# Patient Record
Sex: Female | Born: 1955 | Race: Asian | Hispanic: No | Marital: Single | Smoking: Current every day smoker
Health system: Southern US, Community
[De-identification: ages and names within clinical notes are randomized; demographics above are authoritative.]

## PROBLEM LIST (undated history)

## (undated) DIAGNOSIS — B1920 Unspecified viral hepatitis C without hepatic coma: Secondary | ICD-10-CM

## (undated) DIAGNOSIS — D649 Anemia, unspecified: Secondary | ICD-10-CM

## (undated) DIAGNOSIS — T7840XA Allergy, unspecified, initial encounter: Secondary | ICD-10-CM

## (undated) DIAGNOSIS — G629 Polyneuropathy, unspecified: Secondary | ICD-10-CM

## (undated) HISTORY — DX: Polyneuropathy, unspecified: G62.9

## (undated) HISTORY — DX: Unspecified viral hepatitis C without hepatic coma: B19.20

## (undated) HISTORY — DX: Allergy, unspecified, initial encounter: T78.40XA

## (undated) HISTORY — DX: Anemia, unspecified: D64.9

---

## 1998-05-22 ENCOUNTER — Ambulatory Visit (HOSPITAL_COMMUNITY): Admission: RE | Admit: 1998-05-22 | Discharge: 1998-05-22 | Payer: Self-pay

## 1998-06-10 ENCOUNTER — Encounter: Admission: RE | Admit: 1998-06-10 | Discharge: 1998-09-08 | Payer: Self-pay | Admitting: *Deleted

## 1998-07-25 ENCOUNTER — Encounter: Payer: Self-pay | Admitting: Internal Medicine

## 1998-07-25 ENCOUNTER — Ambulatory Visit (HOSPITAL_COMMUNITY): Admission: RE | Admit: 1998-07-25 | Discharge: 1998-07-25 | Payer: Self-pay | Admitting: Internal Medicine

## 1998-08-05 ENCOUNTER — Ambulatory Visit (HOSPITAL_COMMUNITY): Admission: RE | Admit: 1998-08-05 | Discharge: 1998-08-05 | Payer: Self-pay | Admitting: Internal Medicine

## 1999-04-21 ENCOUNTER — Encounter: Admission: RE | Admit: 1999-04-21 | Discharge: 1999-05-13 | Payer: Self-pay | Admitting: Internal Medicine

## 2002-11-11 ENCOUNTER — Ambulatory Visit (HOSPITAL_COMMUNITY): Admission: RE | Admit: 2002-11-11 | Discharge: 2002-11-11 | Payer: Self-pay | Admitting: Internal Medicine

## 2002-11-11 ENCOUNTER — Encounter (INDEPENDENT_AMBULATORY_CARE_PROVIDER_SITE_OTHER): Payer: Self-pay | Admitting: *Deleted

## 2002-11-11 ENCOUNTER — Encounter: Payer: Self-pay | Admitting: Internal Medicine

## 2003-01-14 ENCOUNTER — Emergency Department (HOSPITAL_COMMUNITY): Admission: EM | Admit: 2003-01-14 | Discharge: 2003-01-14 | Payer: Self-pay | Admitting: Emergency Medicine

## 2003-02-22 HISTORY — PX: LIVER BIOPSY: SHX301

## 2003-08-19 ENCOUNTER — Ambulatory Visit (HOSPITAL_COMMUNITY): Admission: RE | Admit: 2003-08-19 | Discharge: 2003-08-19 | Payer: Self-pay | Admitting: Internal Medicine

## 2003-10-28 ENCOUNTER — Ambulatory Visit: Payer: Self-pay | Admitting: Family Medicine

## 2003-10-31 ENCOUNTER — Ambulatory Visit: Payer: Self-pay | Admitting: *Deleted

## 2003-12-22 ENCOUNTER — Ambulatory Visit: Payer: Self-pay | Admitting: Family Medicine

## 2003-12-29 ENCOUNTER — Ambulatory Visit: Payer: Self-pay | Admitting: Gastroenterology

## 2004-01-26 ENCOUNTER — Ambulatory Visit: Payer: Self-pay | Admitting: Gastroenterology

## 2004-03-01 ENCOUNTER — Ambulatory Visit: Payer: Self-pay | Admitting: Gastroenterology

## 2004-03-12 ENCOUNTER — Ambulatory Visit: Payer: Self-pay | Admitting: Family Medicine

## 2004-03-19 ENCOUNTER — Ambulatory Visit (HOSPITAL_COMMUNITY): Admission: RE | Admit: 2004-03-19 | Discharge: 2004-03-19 | Payer: Self-pay | Admitting: Gastroenterology

## 2004-03-29 ENCOUNTER — Ambulatory Visit: Payer: Self-pay | Admitting: Gastroenterology

## 2004-04-26 ENCOUNTER — Ambulatory Visit: Payer: Self-pay | Admitting: Gastroenterology

## 2004-04-29 ENCOUNTER — Ambulatory Visit: Payer: Self-pay | Admitting: Family Medicine

## 2004-06-03 ENCOUNTER — Ambulatory Visit: Payer: Self-pay | Admitting: Nurse Practitioner

## 2004-10-14 ENCOUNTER — Ambulatory Visit: Payer: Self-pay | Admitting: Gastroenterology

## 2004-10-14 ENCOUNTER — Ambulatory Visit: Payer: Self-pay | Admitting: Family Medicine

## 2004-10-18 ENCOUNTER — Ambulatory Visit (HOSPITAL_COMMUNITY): Admission: RE | Admit: 2004-10-18 | Discharge: 2004-10-18 | Payer: Self-pay | Admitting: Internal Medicine

## 2004-11-11 ENCOUNTER — Ambulatory Visit: Payer: Self-pay | Admitting: Family Medicine

## 2005-02-23 ENCOUNTER — Ambulatory Visit: Payer: Self-pay | Admitting: Family Medicine

## 2005-02-24 ENCOUNTER — Ambulatory Visit (HOSPITAL_COMMUNITY): Admission: RE | Admit: 2005-02-24 | Discharge: 2005-02-24 | Payer: Self-pay | Admitting: Internal Medicine

## 2005-04-07 ENCOUNTER — Ambulatory Visit: Payer: Self-pay | Admitting: Gastroenterology

## 2005-09-16 ENCOUNTER — Ambulatory Visit: Payer: Self-pay | Admitting: Family Medicine

## 2005-11-29 ENCOUNTER — Ambulatory Visit: Payer: Self-pay | Admitting: Internal Medicine

## 2006-01-26 ENCOUNTER — Ambulatory Visit: Payer: Self-pay | Admitting: Family Medicine

## 2006-02-09 ENCOUNTER — Ambulatory Visit: Payer: Self-pay | Admitting: Family Medicine

## 2006-02-10 ENCOUNTER — Ambulatory Visit: Payer: Self-pay | Admitting: Family Medicine

## 2006-04-06 ENCOUNTER — Ambulatory Visit: Payer: Self-pay | Admitting: Gastroenterology

## 2006-07-31 ENCOUNTER — Ambulatory Visit: Payer: Self-pay | Admitting: Internal Medicine

## 2006-08-07 ENCOUNTER — Ambulatory Visit (HOSPITAL_COMMUNITY): Admission: RE | Admit: 2006-08-07 | Discharge: 2006-08-07 | Payer: Self-pay | Admitting: Family Medicine

## 2006-08-11 ENCOUNTER — Encounter: Admission: RE | Admit: 2006-08-11 | Discharge: 2006-08-11 | Payer: Self-pay | Admitting: Family Medicine

## 2006-11-08 ENCOUNTER — Encounter (INDEPENDENT_AMBULATORY_CARE_PROVIDER_SITE_OTHER): Payer: Self-pay | Admitting: *Deleted

## 2007-03-09 ENCOUNTER — Ambulatory Visit: Payer: Self-pay | Admitting: Internal Medicine

## 2007-03-09 ENCOUNTER — Encounter (INDEPENDENT_AMBULATORY_CARE_PROVIDER_SITE_OTHER): Payer: Self-pay | Admitting: Family Medicine

## 2007-03-09 LAB — CONVERTED CEMR LAB
ALT: 17 units/L (ref 0–35)
AST: 22 units/L (ref 0–37)
Alkaline Phosphatase: 55 units/L (ref 39–117)
Basophils Absolute: 0 10*3/uL (ref 0.0–0.1)
Basophils Relative: 0 % (ref 0–1)
CO2: 21 meq/L (ref 19–32)
Creatinine, Ser: 0.61 mg/dL (ref 0.40–1.20)
Eosinophils Absolute: 1.2 10*3/uL — ABNORMAL HIGH (ref 0.0–0.7)
Eosinophils Relative: 13 % — ABNORMAL HIGH (ref 0–5)
HCT: 39.3 % (ref 36.0–46.0)
Hemoglobin: 13.1 g/dL (ref 12.0–15.0)
Lymphocytes Relative: 32 % (ref 12–46)
MCHC: 33.3 g/dL (ref 30.0–36.0)
Monocytes Absolute: 0.7 10*3/uL (ref 0.1–1.0)
Platelets: 248 10*3/uL (ref 150–400)
RDW: 13.8 % (ref 11.5–15.5)
Sodium: 138 meq/L (ref 135–145)
TSH: 0.947 microintl units/mL (ref 0.350–5.50)
Total Bilirubin: 1 mg/dL (ref 0.3–1.2)
Total Protein: 7.1 g/dL (ref 6.0–8.3)

## 2007-03-23 ENCOUNTER — Ambulatory Visit (HOSPITAL_COMMUNITY): Admission: RE | Admit: 2007-03-23 | Discharge: 2007-03-23 | Payer: Self-pay | Admitting: Gastroenterology

## 2007-08-30 ENCOUNTER — Encounter: Admission: RE | Admit: 2007-08-30 | Discharge: 2007-08-30 | Payer: Self-pay | Admitting: Family Medicine

## 2007-09-12 ENCOUNTER — Encounter: Admission: RE | Admit: 2007-09-12 | Discharge: 2007-09-12 | Payer: Self-pay | Admitting: Family Medicine

## 2008-06-23 ENCOUNTER — Ambulatory Visit: Payer: Self-pay | Admitting: Internal Medicine

## 2008-07-07 ENCOUNTER — Ambulatory Visit: Payer: Self-pay | Admitting: Internal Medicine

## 2008-07-18 ENCOUNTER — Ambulatory Visit: Payer: Self-pay | Admitting: Internal Medicine

## 2008-08-18 ENCOUNTER — Ambulatory Visit: Payer: Self-pay | Admitting: Internal Medicine

## 2008-09-04 ENCOUNTER — Encounter: Admission: RE | Admit: 2008-09-04 | Discharge: 2008-09-04 | Payer: Self-pay | Admitting: Internal Medicine

## 2009-08-14 ENCOUNTER — Ambulatory Visit: Payer: Self-pay | Admitting: Internal Medicine

## 2009-08-14 LAB — CONVERTED CEMR LAB
ALT: 16 units/L (ref 0–35)
Albumin: 4.8 g/dL (ref 3.5–5.2)
Alkaline Phosphatase: 59 units/L (ref 39–117)
Basophils Relative: 0 % (ref 0–1)
CEA: 1.6 ng/mL (ref 0.0–5.0)
CO2: 27 meq/L (ref 19–32)
FSH: 111 milliintl units/mL
Ferritin: 29 ng/mL (ref 10–291)
Hemoglobin: 13.8 g/dL (ref 12.0–15.0)
LDL Cholesterol: 74 mg/dL (ref 0–99)
Lymphocytes Relative: 33 % (ref 12–46)
MCHC: 32.3 g/dL (ref 30.0–36.0)
Monocytes Absolute: 0.4 10*3/uL (ref 0.1–1.0)
Monocytes Relative: 6 % (ref 3–12)
Neutro Abs: 1.9 10*3/uL (ref 1.7–7.7)
Neutrophils Relative %: 32 % — ABNORMAL LOW (ref 43–77)
Potassium: 4.5 meq/L (ref 3.5–5.3)
RBC: 4.39 M/uL (ref 3.87–5.11)
Saturation Ratios: 31 % (ref 20–55)
Sodium: 138 meq/L (ref 135–145)
TIBC: 390 ug/dL (ref 250–470)
Total Bilirubin: 1.3 mg/dL — ABNORMAL HIGH (ref 0.3–1.2)
Total Protein: 7.9 g/dL (ref 6.0–8.3)
UIBC: 269 ug/dL
VLDL: 15 mg/dL (ref 0–40)
WBC: 6 10*3/uL (ref 4.0–10.5)

## 2009-09-09 ENCOUNTER — Ambulatory Visit: Payer: Self-pay | Admitting: Internal Medicine

## 2009-10-07 ENCOUNTER — Ambulatory Visit: Payer: Self-pay | Admitting: Internal Medicine

## 2010-06-02 ENCOUNTER — Ambulatory Visit (INDEPENDENT_AMBULATORY_CARE_PROVIDER_SITE_OTHER): Payer: Self-pay | Admitting: Family Medicine

## 2010-06-02 ENCOUNTER — Encounter: Payer: Self-pay | Admitting: Family Medicine

## 2010-06-02 ENCOUNTER — Ambulatory Visit (HOSPITAL_COMMUNITY)
Admission: RE | Admit: 2010-06-02 | Discharge: 2010-06-02 | Disposition: A | Discharge status: Home / Self Care | Payer: Self-pay | Source: Ambulatory Visit | Attending: Family Medicine | Admitting: Family Medicine

## 2010-06-02 ENCOUNTER — Other Ambulatory Visit: Payer: Self-pay

## 2010-06-02 DIAGNOSIS — J309 Allergic rhinitis, unspecified: Secondary | ICD-10-CM

## 2010-06-02 DIAGNOSIS — G5790 Unspecified mononeuropathy of unspecified lower limb: Secondary | ICD-10-CM

## 2010-06-02 DIAGNOSIS — E785 Hyperlipidemia, unspecified: Secondary | ICD-10-CM | POA: Insufficient documentation

## 2010-06-02 DIAGNOSIS — G8929 Other chronic pain: Secondary | ICD-10-CM

## 2010-06-02 DIAGNOSIS — G629 Polyneuropathy, unspecified: Secondary | ICD-10-CM | POA: Insufficient documentation

## 2010-06-02 DIAGNOSIS — K219 Gastro-esophageal reflux disease without esophagitis: Secondary | ICD-10-CM

## 2010-06-02 DIAGNOSIS — M545 Low back pain: Secondary | ICD-10-CM

## 2010-06-02 DIAGNOSIS — B1920 Unspecified viral hepatitis C without hepatic coma: Secondary | ICD-10-CM

## 2010-06-02 DIAGNOSIS — R002 Palpitations: Secondary | ICD-10-CM | POA: Insufficient documentation

## 2010-06-02 DIAGNOSIS — R079 Chest pain, unspecified: Secondary | ICD-10-CM

## 2010-06-02 DIAGNOSIS — N6009 Solitary cyst of unspecified breast: Secondary | ICD-10-CM

## 2010-06-02 DIAGNOSIS — I10 Essential (primary) hypertension: Secondary | ICD-10-CM | POA: Insufficient documentation

## 2010-06-02 DIAGNOSIS — N6002 Solitary cyst of left breast: Secondary | ICD-10-CM

## 2010-06-02 LAB — CBC
HCT: 35.1 % — ABNORMAL LOW (ref 36.0–46.0)
MCH: 32.3 pg (ref 26.0–34.0)
MCHC: 34.2 g/dL (ref 30.0–36.0)
MCV: 94.4 fL (ref 78.0–100.0)
RDW: 13.5 % (ref 11.5–15.5)
WBC: 6.4 10*3/uL (ref 4.0–10.5)

## 2010-06-02 LAB — TSH: TSH: 0.827 u[IU]/mL (ref 0.350–4.500)

## 2010-06-02 LAB — COMPREHENSIVE METABOLIC PANEL
ALT: 18 U/L (ref 0–35)
AST: 27 U/L (ref 0–37)
Alkaline Phosphatase: 42 U/L (ref 39–117)
Sodium: 139 mEq/L (ref 135–145)
Total Bilirubin: 0.7 mg/dL (ref 0.3–1.2)
Total Protein: 7.3 g/dL (ref 6.0–8.3)

## 2010-06-02 LAB — LIPID PANEL
HDL: 66 mg/dL (ref >39)
LDL Cholesterol: 92 mg/dL (ref 0–99)
Total CHOL/HDL Ratio: 2.5 Ratio
VLDL: 10 mg/dL (ref 0–40)

## 2010-06-02 MED ORDER — METOPROLOL TARTRATE 25 MG PO TABS: ORAL_TABLET | ORAL | Status: DC

## 2010-06-02 MED ORDER — CLOBETASOL 17 PROPIONATE 0.5 % POWD: Status: DC

## 2010-06-02 NOTE — Patient Instructions (Signed)
It was very nice to meet you---I am honored to have another member of the [facility] family! I will send you a note about your blood work in the mail. I have started a new cream today for your skin rash and I have changed one of your blood pressure medicines. I would like to see you back in about a month or so.  Regarding your medicines---for now STOP the hydrochlorothiazide--keep it, we may want to try it again sometime. In it's place I will START lopressor--you will take one pill TWICE a day. Keep taking the rest of your medicines the same. The new cream for your rash will be three times a day on your neck and once a day on your face. You can use the cream you already have--the hydrocortisone---on you face two other times a day.

## 2010-06-03 ENCOUNTER — Encounter: Payer: Self-pay | Admitting: Family Medicine

## 2010-06-03 DIAGNOSIS — G8929 Other chronic pain: Secondary | ICD-10-CM | POA: Insufficient documentation

## 2010-06-03 DIAGNOSIS — J309 Allergic rhinitis, unspecified: Secondary | ICD-10-CM | POA: Insufficient documentation

## 2010-06-03 DIAGNOSIS — K219 Gastro-esophageal reflux disease without esophagitis: Secondary | ICD-10-CM | POA: Insufficient documentation

## 2010-06-03 DIAGNOSIS — N6002 Solitary cyst of left breast: Secondary | ICD-10-CM | POA: Insufficient documentation

## 2010-06-03 DIAGNOSIS — B1920 Unspecified viral hepatitis C without hepatic coma: Secondary | ICD-10-CM | POA: Insufficient documentation

## 2010-06-03 HISTORY — DX: Other chronic pain: G89.29

## 2010-06-03 NOTE — Progress Notes (Signed)
  Subjective:    Patient ID: Sarah Pruitt, female    DOB: 10-22-1955, 55 y.o.   MRN: [id number]  HPI  New patient here for establishment of care  As chronic low back pain. It is unchanged.  Has hypertension and is complaining of some chest pain most nights when she lies down. Feels like a rock is sitting on her chest. Does not notice this with exertion. Does note that she is more fatigued recently been previously. Has not had any unusual weight loss or weight gain.  Has hypertension and takes her medications regularly but feels her heart flutter several times throughout the day and this concerned her quite a bit  Has a rash on her face and neck. They gave her some cream which seems to help a little bit.   Has a lot of problems with foot numbness and burning. It is mostly the forefoot. Constant very bothersome to her has decreased sensation there. This has been this way for several years. She is currently on Neurontin, and it doesn't seem to help much. She has occasional numbness in her hands and notices some occasional color change but this is not as bothersome to her as the nerve issues in her feet. Review of Systems  Constitutional: Positive for fatigue. Negative for fever, activity change, appetite change and unexpected weight change.  HENT: Negative for neck pain.   Eyes: Negative for pain and visual disturbance.  Respiratory: Negative for cough and shortness of breath.   Gastrointestinal: Negative for abdominal pain.  Genitourinary: Negative for dysuria and vaginal bleeding.  Musculoskeletal: Positive for back pain.  Neurological: Positive for numbness. Negative for tremors, weakness, light-headedness and headaches.       Objective:   Physical Exam  Constitutional: She is oriented to person, place, and time. She appears well-developed and well-nourished.  Eyes: Conjunctivae are normal.  Cardiovascular: Normal rate, regular rhythm, normal heart sounds and intact distal pulses.     No murmur heard. Pulmonary/Chest: Effort normal and breath sounds normal. She has no wheezes. She has no rales.  Abdominal: Soft. Bowel sounds are normal.  Musculoskeletal: Normal range of motion. She exhibits no edema and no tenderness.  Neurological: She is alert and oriented to person, place, and time. She has normal reflexes.       Decreased sensation to soft touch but not proprioception in B forefoot.  Skin:       Atopic changes mild on face around mouth. Significant rectangular area and erythema, excoriation, lichenification on posterior neck--4x6 cm.  Psychiatric: Her behavior is normal. Judgment and thought content normal.          Assessment & Plan:  #1 hypertension: Seems well controlled. Gives history consistent with some PVCs. These seem to bother her quite a bit. EKG today showed normal sinus rhythm Will change her HCTZ to Lopressor at a low dose and follow her up in 3 weeks.  #2 peripheral neuropathy. I don't have any of her old records. She is on vitamin B12. He is actually on fairly high doses of that. Will check B12 level thyroid CBC CMP.  #3 eczema: The area on the back of her neck is fairly severe we'll give her some clobetasol for that and follow her up in 3 weeks.  #4 health maintenance. She is current on her Pap smear and her mammogram. Chest had history of solitary left breast cyst. Sitting she gets an ultrasound every year after her mammogram. Not having any issues currently

## 2010-07-07 ENCOUNTER — Ambulatory Visit (INDEPENDENT_AMBULATORY_CARE_PROVIDER_SITE_OTHER): Payer: Self-pay | Admitting: Family Medicine

## 2010-07-07 ENCOUNTER — Encounter: Payer: Self-pay | Admitting: Family Medicine

## 2010-07-07 DIAGNOSIS — I10 Essential (primary) hypertension: Secondary | ICD-10-CM

## 2010-07-07 DIAGNOSIS — L259 Unspecified contact dermatitis, unspecified cause: Secondary | ICD-10-CM

## 2010-07-07 DIAGNOSIS — G5790 Unspecified mononeuropathy of unspecified lower limb: Secondary | ICD-10-CM

## 2010-07-07 DIAGNOSIS — L309 Dermatitis, unspecified: Secondary | ICD-10-CM | POA: Insufficient documentation

## 2010-07-07 MED ORDER — OMEPRAZOLE 20 MG PO CPDR: 20.0000 mg | DELAYED_RELEASE_CAPSULE | Freq: Every day | ORAL | Status: DC

## 2010-07-07 MED ORDER — METOPROLOL TARTRATE 50 MG PO TABS: 50.0000 mg | ORAL_TABLET | Freq: Two times a day (BID) | ORAL | Status: DC

## 2010-07-07 NOTE — Progress Notes (Signed)
  Subjective:    Patient ID: Sarah Pruitt, female    DOB: 01-03-1956, 55 y.o.   MRN: [id number]  HPI  Followup change of hypertensive medication. She is tolerating the extremity low-dose beta blocker without issues. She continues to feel the heart fluttering.  #2 financial issues with getting her medication since she is no longer connected with [facility].  #3 continues to have numbness and tingling in bilateral hands and feet.  #4: Followup rash. This has almost totally resolved and she is quite happy about that.  Review of Systems    Pertinent review of systems: negative for fever or unusual weight change.  Objective:   Physical Exam    GENERALl: Well developed, well nourished, in no acute distress. NECK: Supple, FROM, without lymphadenopathy.  THYROID: normal without nodularity CAROTID ARTERIES: without bruits LUNGS: clear to auscultation bilaterally. No wheezes or rales. HEART: Regular rate and rhythm, no murmurs ABDOMEN: soft with positive bowel sounds NEURO: decreased sensation to soft touch fingertips and feet.      Assessment & Plan:  #1: Hypertension with frequent PVCs by history. I started the beta blocker at an extremely low dose to make sure she would tolerate the symptoms and side effects that are possible. She seems to have tolerated this well so we will increase her today. This is still probably not the day she needs it she is quite hypertensive today. I'll see her back in 3-4 weeks.  #2: Neuropathy. I wanted to increase her Neurontin but with her new financial issues some nausea regard to be able to even keep her on the Neurontin. I want to check into some options for the drug company to provide that. #3. Eczema totally resolved for now

## 2010-07-07 NOTE — Patient Instructions (Signed)
For now we are STOPPING the following medicines: B6, B 12, valtrex (valacyclovir),  And vytorin (ezetamid/ simvastatim)  I am  REPLACING:Nexium (esommeprazole) with omeprazole 20 mg REPLACING Metoprolol 25 mg taking 1/2 tab twice a day with metoprolol 50 mg taking a whole tab twice a day. We will keep the gabapentin teh same for now--I will see if I can find a way to change that to something else or get some from a program by the time I see you in 2 weeks

## 2010-07-09 NOTE — Op Note (Signed)
NAME:  Sarah Pruitt, [name]              ACCOUNT NO.:  [id number]   MEDICAL RECORD NO.:  [id number]          PATIENT TYPE:  AMB   LOCATION:  ENDO                         FACILITY:  [facility]   PHYSICIAN:  James L. Malon Kindle., M.D.DATE OF BIRTH:  1955/08/28   DATE OF PROCEDURE:  03/19/2004  DATE OF DISCHARGE:                                 OPERATIVE REPORT   PROCEDURE:  Esophagogastroduodenoscopy.   MEDICATIONS GIVEN:  Fentanyl 40 mcg, Versed 4 mg IV, Cetacaine spray.   INDICATIONS FOR PROCEDURE:  Epigastric pain.   DESCRIPTION OF PROCEDURE:  The procedure was explained to the patient and  consent obtained.  With the patient in the left lateral decubitus position,  the Olympus scope was inserted and advanced.  The stomach was entered, the  pylorus identified and passed.  The duodenum including the bulb and second  portion were seen well and was unremarkable.  The scope was withdrawn back  into the stomach.  The pyloric channel, antrum, and body were normal.  The  fundus and cardia were seen on the retroflex view and was normal.  There was  a hiatal hernia with widely patent GE junction and somewhat reddened distal  esophagus.  The proximal esophagus was normal.  The scope was withdrawn.  The patient tolerated the procedure well.   ASSESSMENT:  Epigastric pain, possible reflux.   PLAN:  Will continue the Nexium, give a reflux sheet, see back in our office  in 4-6 weeks.      JLE/MEDQ  D:  03/19/2004  T:  03/19/2004  Job:  [id number]   cc:   Tresa Endo L. Philipp Deputy, M.D.  [phone]  [place] [address]  Fax: [phone]

## 2010-07-23 ENCOUNTER — Encounter: Payer: Self-pay | Admitting: Family Medicine

## 2010-07-23 ENCOUNTER — Ambulatory Visit (INDEPENDENT_AMBULATORY_CARE_PROVIDER_SITE_OTHER): Payer: Self-pay | Admitting: Family Medicine

## 2010-07-23 DIAGNOSIS — G5600 Carpal tunnel syndrome, unspecified upper limb: Secondary | ICD-10-CM

## 2010-07-23 DIAGNOSIS — G5602 Carpal tunnel syndrome, left upper limb: Secondary | ICD-10-CM

## 2010-07-23 MED ORDER — FAMOTIDINE 20 MG PO TABS: 20.0000 mg | ORAL_TABLET | Freq: Two times a day (BID) | ORAL | Status: DC

## 2010-07-23 MED ORDER — AMITRIPTYLINE HCL 75 MG PO TABS: 75.0000 mg | ORAL_TABLET | Freq: Every day | ORAL | Status: DC

## 2010-07-23 NOTE — Progress Notes (Signed)
  Subjective:    Patient ID: Sarah Pruitt, female    DOB: 05-May-1955, 55 y.o.   MRN: [id number]  HPI  Followup bilateral hand tingling and numbness. No specific injury. This has been going on for many months. Right hand dominant. Left hand has worse symptoms. Symptoms are there all day and all night. Occasionally awakens her at night with pain. The worse time is first thing in the morning. She also had some numbness and tingling in her feet but this is not nearly as severe.  Review of Systems Pertinent review of systems: negative for fever or unusual weight change.     Objective:   Physical Exam     GENERAL: Well-developed well-nourished no acute distress WRISTS: Positive Phalen's bilaterally negative Tinel bilaterally. Neither hand reveals thenar atrophy. Her grip strength is normal. Sensation to soft touch is intact. Radial pulses 2+ bilaterally equal.  ULTRASOUND: Thickened median nerves that are flattened bilaterally were revealed on limited ultrasound.  Volume of left median nerve 1.7 and a right 1.57.  INJECTION: Patient was given informed consent, signed copy in the chart. Appropriate time out was taken. Area prepped and draped in usual sterile fashion. 1/2 cc of kenalog plus  1/2 cc of lidocaine was injected into the leftcarpal tunnel using a(n) vo;ar approach. The patient tolerated the procedure well. There were no complications. Post procedure instructions were given.  Assessment & Plan:  #1 colon neuropathy. I think part of this may be related to carpal tunnel and we gave her night splint today as well as a corticosteroid injection in the left. I will see her in followup in 3-4 weeks. I do not think this is all of her issues as she has symptoms in her feet as well.   #2: Financial issues with some of her medicines namely gabapentin. She is unable to afford that. We'll switch her to amitriptyline. She is also unable to afford omeprazole and we will switch to famotidine.

## 2010-07-23 NOTE — Patient Instructions (Addendum)
You have an appointment with Dr. Jennette Kettle at the [facility] on June 27 at 8:45am. I have changed your gabap[entin to amitriptylene--one 75 mg tab at night. I have changed your omeprazole to famotidine--take one tab TWICE a day.

## 2010-08-18 ENCOUNTER — Ambulatory Visit: Payer: Self-pay | Admitting: Family Medicine

## 2010-08-20 ENCOUNTER — Ambulatory Visit (INDEPENDENT_AMBULATORY_CARE_PROVIDER_SITE_OTHER): Payer: Self-pay | Admitting: Family Medicine

## 2010-08-20 ENCOUNTER — Encounter: Payer: Self-pay | Admitting: Family Medicine

## 2010-08-20 DIAGNOSIS — G609 Hereditary and idiopathic neuropathy, unspecified: Secondary | ICD-10-CM

## 2010-08-20 DIAGNOSIS — I10 Essential (primary) hypertension: Secondary | ICD-10-CM

## 2010-08-20 DIAGNOSIS — G629 Polyneuropathy, unspecified: Secondary | ICD-10-CM

## 2010-08-20 NOTE — Progress Notes (Signed)
  Subjective:    Patient ID: Sarah Pruitt, female    DOB: [birth year], 55 y.o.   MRN: [id number]  HPI  F/u starting several med changes. Some improvement in her neuropathy with teh wrist injection--but not enough improvement that she wants to try a shot in other wrist.  Biggest issue for her now is her Mother's safety--her brother  Is becoming more aggressive and with angry outbursts. She wants to know what she should do--she is afraid the stress is too much for her Mom. She does not fear for her own safety but has some concerns re taht of her Mom. Her brother has had several angry outbursts recently--has not physically assaulted anyone but seems threatening.  Review of Systems    Pertinent review of systems: negative for fever or unusual weight change.  Objective:   Physical Exam     GENERAL: Well-developed, well-nourished, no acute distress. CARDIOVASCULAR: Regular rate and rhythm no murmur gallop or rub LUNGS: Clear to auscultation bilaterally, no rales or wheeze. ABDOMEN: Soft positive bowel sounds NEURO: No gross focal neurological deficits he has subjective sensory paraesthesias  And numbness in hands and some slight difference in sense of soft touch all ten fingers on palmar surface and dorsally on finger tips. MSK: Movement of extremity x 4.     Assessment & Plan:  1. Neuropathy--stay w new med regimen. (amitriptylene) 2. Long discussion re her family issues.  I called teh social worker for her brother who is also my patient and left mssg Sula Soda at [phone]

## 2010-09-22 ENCOUNTER — Ambulatory Visit (INDEPENDENT_AMBULATORY_CARE_PROVIDER_SITE_OTHER): Payer: Self-pay | Admitting: Family Medicine

## 2010-09-22 ENCOUNTER — Encounter: Payer: Self-pay | Admitting: Family Medicine

## 2010-09-22 DIAGNOSIS — G629 Polyneuropathy, unspecified: Secondary | ICD-10-CM

## 2010-09-22 DIAGNOSIS — R002 Palpitations: Secondary | ICD-10-CM

## 2010-09-22 DIAGNOSIS — G609 Hereditary and idiopathic neuropathy, unspecified: Secondary | ICD-10-CM

## 2010-09-22 DIAGNOSIS — K219 Gastro-esophageal reflux disease without esophagitis: Secondary | ICD-10-CM

## 2010-09-22 NOTE — Progress Notes (Signed)
  Subjective:    Patient ID: Sarah Pruitt, female    DOB: 1955/10/18, 55 y.o.   MRN: [id number]  HPI  1 f/u gerd--much better 2-- f/u palpitations--only very occasionally  3--f/u neuropathy--75% improved on amitriptyline. Does have a new area numbness on right lateral thigh, but the burning and tingling in hands and feet much better  4--f/u issues w her brother--she has started going to his mental health appts w him--they have adjusted some meds and things are better.  Review of Systems    Pertinent review of systems: negative for fever or unusual weight change.  Objective:   Physical Exam  GENERAL: Well-developed, well-nourished, no acute distress. CARDIOVASCULAR: Regular rate and rhythm no murmur gallop or rub LUNGS: Clear to auscultation bilaterally, no rales or wheeze. ABDOMEN: Soft positive bowel sounds NEURO: No gross focal neurological deficits. Area of numbness right lateral thigh in area of  lateral femoral cutaneous nerve MSK: Movement of extremity x 4.        Assessment & Plan:  1. GERD--improved 2. Palpitations--significant improvement 3. Peripheral neuropathy  Significant improvement 5. Social issues at home--improved Continue current med regimen--rtc 4-6 m and prn

## 2010-11-22 ENCOUNTER — Other Ambulatory Visit: Payer: Self-pay | Admitting: Family Medicine

## 2010-11-22 NOTE — Telephone Encounter (Signed)
Forwarded to pcp.Sarah Pruitt Lynetta  

## 2010-11-22 NOTE — Telephone Encounter (Signed)
Pt needs refill on Mobic - she had not had it filled from here yet. [facility]

## 2010-11-23 MED ORDER — MELOXICAM 7.5 MG PO TABS: 7.5000 mg | ORAL_TABLET | Freq: Every day | ORAL | Status: DC

## 2010-11-23 NOTE — Telephone Encounter (Signed)
Called and informed of below.

## 2010-11-23 NOTE — Telephone Encounter (Signed)
Dear Cliffton Asters Team This is done plz let her know THANKS! Denny Levy

## 2010-12-09 ENCOUNTER — Other Ambulatory Visit: Payer: Self-pay | Admitting: Family Medicine

## 2010-12-09 MED ORDER — LISINOPRIL 20 MG PO TABS: 20.0000 mg | ORAL_TABLET | Freq: Every day | ORAL | Status: DC

## 2011-06-08 ENCOUNTER — Ambulatory Visit (INDEPENDENT_AMBULATORY_CARE_PROVIDER_SITE_OTHER): Payer: Self-pay | Admitting: Family Medicine

## 2011-06-08 ENCOUNTER — Encounter: Payer: Self-pay | Admitting: Family Medicine

## 2011-06-08 VITALS — BP 145/97 | HR 84 | Temp 97.9°F | Ht 62.0 in | Wt 119.0 lb

## 2011-06-08 DIAGNOSIS — E785 Hyperlipidemia, unspecified: Secondary | ICD-10-CM

## 2011-06-08 DIAGNOSIS — R002 Palpitations: Secondary | ICD-10-CM

## 2011-06-08 DIAGNOSIS — L259 Unspecified contact dermatitis, unspecified cause: Secondary | ICD-10-CM

## 2011-06-08 DIAGNOSIS — G609 Hereditary and idiopathic neuropathy, unspecified: Secondary | ICD-10-CM

## 2011-06-08 DIAGNOSIS — M79609 Pain in unspecified limb: Secondary | ICD-10-CM

## 2011-06-08 DIAGNOSIS — L309 Dermatitis, unspecified: Secondary | ICD-10-CM

## 2011-06-08 DIAGNOSIS — M79673 Pain in unspecified foot: Secondary | ICD-10-CM

## 2011-06-08 DIAGNOSIS — I10 Essential (primary) hypertension: Secondary | ICD-10-CM

## 2011-06-08 DIAGNOSIS — G629 Polyneuropathy, unspecified: Secondary | ICD-10-CM

## 2011-06-08 LAB — COMPREHENSIVE METABOLIC PANEL
Albumin: 4.6 g/dL (ref 3.5–5.2)
BUN: 21 mg/dL (ref 6–23)
CO2: 26 mEq/L (ref 19–32)
Calcium: 9.8 mg/dL (ref 8.4–10.5)
Chloride: 103 mEq/L (ref 96–112)
Glucose, Bld: 111 mg/dL — ABNORMAL HIGH (ref 70–99)
Potassium: 4.4 mEq/L (ref 3.5–5.3)
Sodium: 139 mEq/L (ref 135–145)
Total Protein: 7.6 g/dL (ref 6.0–8.3)

## 2011-06-08 LAB — LDL CHOLESTEROL, DIRECT: Direct LDL: 176 mg/dL — ABNORMAL HIGH

## 2011-06-08 NOTE — Patient Instructions (Addendum)
REGARDING your blood pressure meds---restart the metoprolol at 50 mg a day--take it EVERY day and let's recheck in 6 weeks.  For your dry skin and rash on hands---it is a type of excema--use vaseline once or twice a day and use the hydrocortisone cram once or twice a day as well. It will never go completely away, but it should improve.  I want to see you back in 6 weeks. I will send you a letter about your blood work results.  I will mail you some metatarsal pads to try for  Your toe numbness. It was great to see you---and to see you [facility]!

## 2011-06-09 NOTE — Assessment & Plan Note (Signed)
He did really well controlled on the last 2 office visits. Has not taken her medicine for today so I refilled her medicine today. She is going to keep some blood pressure numbers for me in followup in 4-6 weeks. Would probably be advisable to get lab work sometime at the next visit.

## 2011-06-09 NOTE — Assessment & Plan Note (Signed)
He seemed essentially resolved on the beta blocker.

## 2011-06-09 NOTE — Assessment & Plan Note (Signed)
Has had significant improvement since the initiation of the steroid cream. She's not been using it fairly regularly. We'll have her discontinue the lotion, and Vaseline at night. Increase use of steroid cream. We discussed hand eczema in general.

## 2011-06-09 NOTE — Progress Notes (Signed)
  Subjective:    Patient ID: Sarah Pruitt, female    DOB: 05-Aug-1955, 56 y.o.   MRN: [id number]  HPI  #1. Followup hypertension. She ran out of her medicine 2 days ago and has not taken it since. She ran out of the beta blocker she is continued on the lisinopril. She's had no problems with either one of [place] felt much better overall since she started the medication. Less headaches, less heart palpitations. Denies shortness of breath, denies chest pains. Experiencing no lower extremity edema. #2. Carpal tunnel symptoms in the left hand have returned. She is having some tingling and numbness in the index and long finger as well as the palm. About a year ago we had given her an injection in that totally relieved her symptoms for almost a year. She would like to pursue that again. #3. Continues to have eczema on bilateral hands. She's been using some lotion as a moisturizer and intermittent use of the steroid cream. The palmar part of the rash is improved but she still having difficulty with the distal portions of her fingers and the area around the nail. #4. Left second and third toe numbness and tingling that occurs after she's been standing for a period of time. She's not seeing any color change in the toes. She's had no toe injury.  Review of Systems Denies fever, sweats, chills. Please see history of present illness above for other pertinent review of systems.    Objective:   Physical Exam  Vital signs reviewed. GENERAL: Well-developed, well-nourished, no acute distress. CARDIOVASCULAR: Regular rate and rhythm no murmur gallop or rub LUNGS: Clear to auscultation bilaterally, no rales or wheeze. ABDOMEN: Soft positive bowel sounds NEURO: No gross focal neurological deficits. MSK: Movement of extremity x 4. Positive Phalen's left wrist. Positive Tinel left wrist. No thenar atrophy. Grip strength is normal. Skin: Erythema bilateral palms but no excoriations no scale no flakiness. Area around the  nailbeds distally she has a lot of thickened dry skin with some cracking and peeling. The nails themselves appear normal. Sensation in the hand is normal.      Assessment & Plan:

## 2011-06-20 ENCOUNTER — Encounter: Payer: Self-pay | Admitting: Family Medicine

## 2011-06-20 ENCOUNTER — Other Ambulatory Visit: Payer: Self-pay | Admitting: Family Medicine

## 2011-06-20 MED ORDER — SIMVASTATIN 40 MG PO TABS: 40.0000 mg | ORAL_TABLET | Freq: Every day | ORAL | Status: DC

## 2011-07-20 ENCOUNTER — Encounter: Payer: Self-pay | Admitting: Family Medicine

## 2011-07-20 ENCOUNTER — Ambulatory Visit: Payer: Self-pay | Admitting: Family Medicine

## 2011-07-20 ENCOUNTER — Ambulatory Visit (INDEPENDENT_AMBULATORY_CARE_PROVIDER_SITE_OTHER): Payer: Self-pay | Admitting: Family Medicine

## 2011-07-20 VITALS — BP 122/79 | HR 59 | Temp 98.0°F | Ht 62.0 in | Wt 121.1 lb

## 2011-07-20 DIAGNOSIS — I10 Essential (primary) hypertension: Secondary | ICD-10-CM

## 2011-07-22 NOTE — Assessment & Plan Note (Signed)
Well controlled. No med changes Reviewed labs F/u 6 m

## 2011-07-22 NOTE — Progress Notes (Signed)
  Subjective:    Patient ID: Sarah Pruitt, female    DOB: 03-Aug-1955, 56 y.o.   MRN: [id number]  HPI  HTN F/U: Taking medicines regularly without problems. Denies chest pain, shortness of breath. Home BP readings 110-123 / 70-80.  Review of Systems    Pertinent review of systems: negative for fever or unusual weight change. Objective:   Physical Exam   Vital signs reviewed. GENERAL: Well-developed, well-nourished, no acute distress. CARDIOVASCULAR: Regular rate and rhythm no murmur gallop or rub LUNGS: Clear to auscultation bilaterally, no rales or wheeze. ABDOMEN: Soft positive bowel sounds NEURO: No gross focal neurological deficits. MSK: Movement of extremity x 4.       Assessment & Plan:

## 2011-07-28 ENCOUNTER — Ambulatory Visit (INDEPENDENT_AMBULATORY_CARE_PROVIDER_SITE_OTHER): Payer: Self-pay | Admitting: Family Medicine

## 2011-07-28 VITALS — BP 137/80 | HR 64 | Temp 98.3°F | Ht 62.0 in | Wt 122.0 lb

## 2011-07-28 DIAGNOSIS — M6740 Ganglion, unspecified site: Secondary | ICD-10-CM

## 2011-07-28 NOTE — Patient Instructions (Signed)
It was great to see you today.  It appears you had a ganglion cyst on your wrist. Please keep the bandage on for 30 minutes, then you can take it off. Schedule follow up appointment with Dr. Jennette Kettle in 2-3 weeks to look at your wrist.   If you have any concerns, please call MD.

## 2011-07-28 NOTE — Progress Notes (Signed)
  Subjective:    Patient ID: Sarah Pruitt, female    DOB: Nov 24, 1955, 56 y.o.   MRN: [id number]  HPI  Patient presents to clinic for right hand pain.  She fell onto outstretched hand about 4 weeks ago.  She was gardening, lost balance, and fell on her side on a weeding tool.  About one week ago, she noticed a knot on her RT wrist.  Non-tender.  She denies any recent swelling, redness, or worsening pain.  She does complain of wrist soreness only when she rotates her wrist a certain way.   Denies any fever, chills, nausea/vomiting or any other systemic symptoms.  Review of Systems  Per HPI    Objective:   Physical Exam  Wrist: Inspection normal with no visible erythema or swelling. ROM smooth and normal with good flexion and extension and ulnar/radial deviation that is symmetrical with opposite wrist. Palpated a round, immobile nodule on RT radial aspect of wrist. Tendons without tenderness/ swelling RT: Strength 4/5 in all directions without pain. LT: 5/5 strength in all directions without pain. Negative Finkelstein, tinel's and phalens.   ULTRASOUND:       Assessment & Plan:

## 2011-07-29 ENCOUNTER — Ambulatory Visit: Payer: Self-pay | Admitting: Family Medicine

## 2011-08-02 NOTE — Progress Notes (Signed)
Patient ID: Sarah Pruitt, female   DOB: Jul 24, 1955, 56 y.o.   MRN: [id number] US reveals echoic round cyst like structure ASPIRATION/INJECTION: Patient was given informed consent, signed copy in the chart. Appropriate time out was taken. Area prepped and draped in usual sterile fashion. Local anesthesia with 2 c 1% lidocaine without epi. Attempted aspiration of cyst revealed no fluid. . 1/2 cc of methylprednisolone 40 mg/ml was injected into the area of the cyst. The patient tolerated the procedure well. There were no complications. Post procedure instructions were given.

## 2011-08-04 ENCOUNTER — Telehealth: Payer: Self-pay | Admitting: Family Medicine

## 2011-08-04 NOTE — Telephone Encounter (Signed)
Patient is calling back to find out if with the [facility], does she need a referral for a Colonoscopy.  She said that Dr. Jennette Kettle told her she needed on, but she has not heard back from anyone.

## 2011-08-04 NOTE — Telephone Encounter (Signed)
There is not a referral request for GI for colonoscopy noted in chart. Explained to patient about no GI available for referral through [facility].  Explained how this works  . Will forward message to Dr. Jennette Kettle  to ask about referral and if patient needs referral to [facility] or [facility]  if necessary.   Also patient cancelled her follow up appointment because she states the cyst is gone on her wrist.

## 2011-08-08 NOTE — Telephone Encounter (Signed)
Dear Cliffton Asters Team We can refer her to either [facility] or [facility]---see if she has a preference---maybe you can tell her if tehre is a copay---Ill bet there is--or she can call them and find out if you give her the number---she is resourceful.

## 2011-08-17 ENCOUNTER — Ambulatory Visit: Payer: Self-pay | Admitting: Family Medicine

## 2011-09-18 ENCOUNTER — Other Ambulatory Visit: Payer: Self-pay | Admitting: Family Medicine

## 2011-12-24 ENCOUNTER — Other Ambulatory Visit: Payer: Self-pay | Admitting: Family Medicine

## 2012-03-08 ENCOUNTER — Encounter: Payer: Self-pay | Admitting: Family Medicine

## 2012-03-08 ENCOUNTER — Ambulatory Visit (INDEPENDENT_AMBULATORY_CARE_PROVIDER_SITE_OTHER): Payer: PRIVATE HEALTH INSURANCE | Admitting: Family Medicine

## 2012-03-08 VITALS — BP 166/87 | HR 58 | Temp 99.3°F | Wt 130.6 lb

## 2012-03-08 DIAGNOSIS — K219 Gastro-esophageal reflux disease without esophagitis: Secondary | ICD-10-CM

## 2012-03-08 DIAGNOSIS — I10 Essential (primary) hypertension: Secondary | ICD-10-CM

## 2012-03-08 DIAGNOSIS — L259 Unspecified contact dermatitis, unspecified cause: Secondary | ICD-10-CM

## 2012-03-08 DIAGNOSIS — L309 Dermatitis, unspecified: Secondary | ICD-10-CM

## 2012-03-08 DIAGNOSIS — E785 Hyperlipidemia, unspecified: Secondary | ICD-10-CM

## 2012-03-08 MED ORDER — CLOBETASOL 17 PROPIONATE 0.5 % POWD: Status: DC

## 2012-03-08 MED ORDER — OMEPRAZOLE 40 MG PO CPDR: 40.0000 mg | DELAYED_RELEASE_CAPSULE | Freq: Every day | ORAL | Status: DC

## 2012-03-08 MED ORDER — MELOXICAM 7.5 MG PO TABS: 15.0000 mg | ORAL_TABLET | Freq: Every day | ORAL | Status: DC

## 2012-03-08 NOTE — Patient Instructions (Signed)
I am giving you another prescription for the clobetasol. I want you to use a LOT of it all over your hands at night, them put on a pair of gloves. In the  Morning, use a light coat of vaseline---NO LOTION---and repeat the vaseline a light coat several times a day.  When you run out of the lisinopril 20 mg, get the NEW PRESCRIPTION of the 40 mg filled and take that instead. Continue the metoprolol See me back in TWO MONTHS!

## 2012-03-09 NOTE — Assessment & Plan Note (Signed)
Has been out of her medicine for a little while. We'll do some refills.

## 2012-03-09 NOTE — Assessment & Plan Note (Signed)
Will increase her lisinopril to 40 mg daily. She just got this refilled so we can wait until she completes this prescription and then go to the increase or she can double the current dose until she runs out. I'll see her back when she's on the increased dose.

## 2012-03-09 NOTE — Assessment & Plan Note (Signed)
We'll start her on nightly clobetasol for the hands. DC a general lotion and use Vaseline. Return to clinic one month.

## 2012-03-09 NOTE — Assessment & Plan Note (Signed)
Is having some breakthrough reflux symptoms on the twice a day Pepcid. We'll try  omeprazole 40 mg daily and followup 4-6 weeks

## 2012-03-09 NOTE — Progress Notes (Signed)
  Subjective:    Patient ID: Sarah Pruitt, female    DOB: [birth year], 57 y.o.   MRN: [id number]  HPI  #1. Followup hypertension. She says she's been taking her medicines regularly and without difficulty. She has not been checking her blood pressures. She denies headaches, source of breath, edema, chest pain. #2. Hand eczema. This is gotten worse recently and her mom made her come in to be seen for this. She's been using a little bit of the clobetasol that we had previously given her for a different area of atopic dermatitis. She's been putting small amounts on her fingers once a day. She's been using over-the-counter lotion every 2 hours during the day. Her hands are itching, painful, skin is cracking and there is some bleeding. #3. Having some breakthrough reflux symptoms. Currently taking Pepcid twice a day.  Review of Systems See history of present illness    Objective:   Physical Exam Vital signs are reviewed and elevated blood pressure is noted. GENERAL: Well-developed female no acute distress CARDIOVASCULAR regular rate and rhythm without murmur gallop or rub NECK: No lymphadenopathy no thyromegaly no carotid bruits. LUNGS: Clear to auscultation bilaterally SKIN. Hands bilaterally have eczematous changes with some cracking and bleeding over the dorsum of the PIP and DIP joints. She has a small callus on the left palmar area.       Assessment & Plan:

## 2012-05-23 ENCOUNTER — Encounter: Payer: Self-pay | Admitting: Family Medicine

## 2012-05-23 ENCOUNTER — Ambulatory Visit (INDEPENDENT_AMBULATORY_CARE_PROVIDER_SITE_OTHER): Payer: PRIVATE HEALTH INSURANCE | Admitting: Family Medicine

## 2012-05-23 VITALS — BP 175/81 | HR 57 | Temp 98.5°F | Ht 62.0 in | Wt 128.8 lb

## 2012-05-23 DIAGNOSIS — I10 Essential (primary) hypertension: Secondary | ICD-10-CM

## 2012-05-25 NOTE — Progress Notes (Signed)
Patient ID: Sarah Pruitt, female   DOB: 24-May-1955, 57 y.o.   MRN: [id number] Patient is here for followup of her hypertension. She has not yet started the increased dose however for blood pressure medicine. She is otherwise doing well. SUBJECTIVE: Well-developed female no acute distress vital signs are reviewed ASSESSMENT: Hypertension. Still think we need to increase her dose so she'll do that now see her back in 4 weeks after she's increased her dose.

## 2012-06-28 ENCOUNTER — Ambulatory Visit (INDEPENDENT_AMBULATORY_CARE_PROVIDER_SITE_OTHER): Payer: PRIVATE HEALTH INSURANCE | Admitting: Family Medicine

## 2012-06-28 VITALS — BP 149/87 | HR 66 | Temp 98.4°F | Wt 124.0 lb

## 2012-06-28 DIAGNOSIS — R221 Localized swelling, mass and lump, neck: Secondary | ICD-10-CM

## 2012-06-28 DIAGNOSIS — R220 Localized swelling, mass and lump, head: Secondary | ICD-10-CM | POA: Insufficient documentation

## 2012-06-28 MED ORDER — AMOXICILLIN-POT CLAVULANATE 875-125 MG PO TABS: 1.0000 | ORAL_TABLET | Freq: Two times a day (BID) | ORAL | Status: DC

## 2012-06-28 NOTE — Patient Instructions (Addendum)
I think you have a skin infection on your face causing your pain and swelling. I have printed off an antibiotic to take for the next 10 days. If you have increased pain, swelling, drooling, rash or high fever, please seen immediate care.  Please make an appointment to come back on Monday or Tuesday for follow up.  Meridith Romick M. Neeka Urista, M.D.

## 2012-06-28 NOTE — Assessment & Plan Note (Signed)
Based on history and exam, this is more than likely a soft tissue infection. Patient was discussed with and examined by Dr. Gwendolyn Grant as well. At this time, do not think this infection has spread to the bone and there is no fluctuant area to be drained. Will treat with Augmentin BID x 10 days. Printed Rx for her to take to [facility]. If too expensive there, she should try the health dept for coverage instead. Given red flag symptoms that should prompt her to return for immediate evaluation, and she will follow up in 4 days for routine check up. Pt agrees.

## 2012-06-28 NOTE — Progress Notes (Signed)
Patient ID: Sarah Pruitt, female   DOB: 1955/09/26, 57 y.o.   MRN: [id number]  [facility] Lashay Osborne M. Chris Narasimhan, MD Phone: [phone]   Subjective: HPI: Patient is a 57 y.o. female presenting to clinic today for same day appointment for swollen/painful cheek. Started Sunday with pain in the TM joint with achiness all over her body. She took ibuprofen which helped the achiness but didn't help the jaw. Cheek swollen since yesterday. No obvious redness. No tooth pain when it started, but now the entire jaw and bottom teeth hurt. Never had anything like this before. She states she has never had a Tdap. No know injury. She did work in the garden on Sunday and centipede bit her twice. No problem breathing, but does have problem opening mouth and swallowing due to pain, but no drooling.  History Reviewed: Non smoker.  ROS: Please see HPI above.  Objective: Office vital signs reviewed. There were no vitals taken for this visit.  Physical Examination:  General: Awake, alert. Appears uncomfortable but not in distress HEENT: MMM. Right cheek with obvious swelling in middle of mandible with taught skin and some redness. TTP of mandible. +swelling without induration. No tooth decay/abcess. Only able to open mouth about 3/4 inch. No obvious drooling. No pop appreciated in TMJ, and no true TTP of that area. Ears wnl bilaterally.  Neck: No masses palpated. No LAD Pulm: CTAB, no wheezes Cardio: RRR, no murmurs appreciated Neuro: Grossly intact  Assessment: 57 y.o. female with swelling of right side of face  Plan: See Problem List and After Visit Summary

## 2012-07-03 ENCOUNTER — Ambulatory Visit (INDEPENDENT_AMBULATORY_CARE_PROVIDER_SITE_OTHER): Payer: PRIVATE HEALTH INSURANCE | Admitting: Family Medicine

## 2012-07-03 ENCOUNTER — Encounter: Payer: Self-pay | Admitting: Family Medicine

## 2012-07-03 VITALS — BP 135/85 | HR 60 | Temp 98.1°F | Ht 61.0 in | Wt 123.0 lb

## 2012-07-03 DIAGNOSIS — R220 Localized swelling, mass and lump, head: Secondary | ICD-10-CM

## 2012-07-03 NOTE — Patient Instructions (Addendum)
It was good to see you today. I am glad you are doing better. Continue taking all of the antibiotic. If the swelling is gone next week, you do not need to come back. If it is still swollen or if you notice any redness, please make an appointment to come back next week.  YOu can use heat on the area to help with the pain and swelling.  Micahel Omlor M. Geniene List, M.D.

## 2012-07-03 NOTE — Progress Notes (Signed)
Patient ID: Sarah Pruitt, female   DOB: 1955-10-23, 57 y.o.   MRN: [id number]  [facility] Hyla Coard M. Jimmi Sidener, MD Phone: [phone]   Subjective: HPI: Patient is a 57 y.o. female presenting to clinic today for follow up for facial soft tissue infection. She was started on Augmentin and has been tolerating that well. Her body aches, facial swelling and redness have all improved. She continues to feel a "lump" there but overall much improved. No problems breathing, eating or swallowing. She can open and close her mouth better.   History Reviewed: Non smoker.  ROS: Please see HPI above.  Objective: Office vital signs reviewed. BP 135/85  Pulse 60  Temp(Src) 98.1 F (36.7 C) (Oral)  Ht 5\' 1"  (1.549 m)  Wt 123 lb (55.792 kg)  BMI 23.25 kg/m2  Physical Examination:  General: Awake, alert. NAD. Pleasant and smiling today. HEENT: Atraumatic, normocephalic. Very small amount of swelling of right mandible. Some induration palpated but no fluctuance. Redness has resolved. Able to open mouth with no difficulty. No lesions inside mouth. No drooling.  Neck: No masses palpated. No LAD Pulm: CTAB, no wheezes Cardio: RRR, no murmurs appreciated Neuro: Grossly intact  Assessment: 57 y.o. female with facial infection, resolving.  Plan: See Problem List and After Visit Summary

## 2012-07-03 NOTE — Assessment & Plan Note (Signed)
Infection has much improved with Augmentin. Patient to continue total of 10 days. Use heat on area for comfort, but expect a full recovery given her improvement so far. RTC next week if she has any concerns about how she is healing, or return to see PCP as scheduled. Pt voices understanding.

## 2012-08-15 ENCOUNTER — Ambulatory Visit (INDEPENDENT_AMBULATORY_CARE_PROVIDER_SITE_OTHER): Payer: Self-pay | Admitting: Family Medicine

## 2012-08-15 ENCOUNTER — Encounter: Payer: Self-pay | Admitting: Family Medicine

## 2012-08-15 VITALS — BP 164/100 | HR 79 | Temp 98.6°F | Ht 61.0 in | Wt 118.0 lb

## 2012-08-15 DIAGNOSIS — M25439 Effusion, unspecified wrist: Secondary | ICD-10-CM

## 2012-08-15 DIAGNOSIS — R221 Localized swelling, mass and lump, neck: Secondary | ICD-10-CM

## 2012-08-15 DIAGNOSIS — M25431 Effusion, right wrist: Secondary | ICD-10-CM | POA: Insufficient documentation

## 2012-08-15 DIAGNOSIS — R220 Localized swelling, mass and lump, head: Secondary | ICD-10-CM

## 2012-08-15 MED ORDER — PENICILLIN V POTASSIUM 250 MG PO TABS: 250.0000 mg | ORAL_TABLET | Freq: Three times a day (TID) | ORAL | Status: DC

## 2012-08-15 NOTE — Assessment & Plan Note (Signed)
Restart antibiotics- PCN.  Pt does not have dentist, so will get panorex once orange card is reinstated.  Concern for tooth abscess given the recurrence of jaw swelling off antibiotics.  F/u next month with PCP.

## 2012-08-15 NOTE — Patient Instructions (Addendum)
It was nice to meet you today.  For your wrist, I want you to get xrays to make sure there is not something broken.  For your jaw swelling, I am restarting antibiotics.  Since you do not have a dentist, I also want you to get an xray of your jaw done to see if there is an abscess in your tooth.  This can wait until after you get your orange card renewed. You can get the antibiotic for FREE at [facility].  Come back to see Dr. Jennette Kettle in the next few weeks.

## 2012-08-15 NOTE — Assessment & Plan Note (Signed)
Tender over scaphoid, continued swelling and pain x2 weeks s/p fall. Will send for xrays to r/o scaphoid (or other) fracture.  Pt aware this is important, she has orange card appt later today and will go today or tomorrow for xray.

## 2012-08-15 NOTE — Progress Notes (Signed)
S: Pt comes in today for SDA for hand swelling after fall.  She had a fall on 6/13 (almost 2 weeks ago) when she slipped on a hardwood floor.  She landed mostly on her bottom, but did brace herself with her right arm/wrist.  She had swelling and pain in her wrist not long after the fall.  Pain has improved, but is still painful when she touches certain spots of when she tries to push/pull/lift things.  The swelling has gotten a little better.  She initially had decreased ROM but this has resolved.  She did not have any bruising or redness.  She has never had wrist problems or surgeries in the past.    Also concerned about swelling on face, was seen ~6 weeks ago and treated with augmentin.  Swelling is in the same spot as before.  Went to bed 2 nights ago without any swelling but woke up yesterday morning and jaw line was swollen again. No redness, no fevers, no drainage, no tooth pain. + soreness, mildly hurts when you touch the area- similar to before.  Does not have a dentist, is not sure the last time she had a dental exam.   ROS: Per HPI  History  Smoking status  . Current Every Day Smoker  Smokeless tobacco  . Not on file    O:  Filed Vitals:   08/15/12 0911  BP: 164/100  Pulse: 79  Temp: 98.6 F (37 C)    Gen: NAD HEENT: MMM, dentition is ok, no obvious abscess, no gum abnormalities, no tooth pain with palpation, mild R jaw line swelling, no specific abscess or nodular able to be appreciate, more so just soft tissue swelling, no cervical LAD, no trismus Ext: R wrist with lateral swelling, + TTP over scaphoid/base of right thumb as well as base of middle finger; full ROM of wrist, 5/5 grip strength    A/P: 57 y.o. female p/w wrist injury/swelling, jaw sweling -See problem list -f/u in 2-4 weeks with PCP

## 2012-08-16 ENCOUNTER — Ambulatory Visit (HOSPITAL_COMMUNITY)
Admission: RE | Admit: 2012-08-16 | Discharge: 2012-08-16 | Disposition: A | Discharge status: Home / Self Care | Payer: PRIVATE HEALTH INSURANCE | Source: Ambulatory Visit | Attending: Family Medicine | Admitting: Family Medicine

## 2012-08-16 DIAGNOSIS — W19XXXA Unspecified fall, initial encounter: Secondary | ICD-10-CM | POA: Insufficient documentation

## 2012-08-16 DIAGNOSIS — M25431 Effusion, right wrist: Secondary | ICD-10-CM

## 2012-08-16 DIAGNOSIS — R220 Localized swelling, mass and lump, head: Secondary | ICD-10-CM | POA: Insufficient documentation

## 2012-08-16 DIAGNOSIS — M25539 Pain in unspecified wrist: Secondary | ICD-10-CM | POA: Insufficient documentation

## 2012-08-17 ENCOUNTER — Other Ambulatory Visit: Payer: Self-pay

## 2012-08-17 DIAGNOSIS — Z1231 Encounter for screening mammogram for malignant neoplasm of breast: Secondary | ICD-10-CM

## 2012-08-20 ENCOUNTER — Other Ambulatory Visit: Payer: Self-pay | Admitting: *Deleted

## 2012-08-20 MED ORDER — SIMVASTATIN 40 MG PO TABS: 40.0000 mg | ORAL_TABLET | Freq: Every day | ORAL | Status: DC

## 2012-08-27 ENCOUNTER — Ambulatory Visit (INDEPENDENT_AMBULATORY_CARE_PROVIDER_SITE_OTHER): Payer: PRIVATE HEALTH INSURANCE | Admitting: Family Medicine

## 2012-08-27 ENCOUNTER — Encounter: Payer: Self-pay | Admitting: Family Medicine

## 2012-08-27 VITALS — BP 129/68 | HR 48 | Temp 98.2°F | Ht 61.0 in | Wt 121.6 lb

## 2012-08-27 DIAGNOSIS — M25431 Effusion, right wrist: Secondary | ICD-10-CM

## 2012-08-27 DIAGNOSIS — R220 Localized swelling, mass and lump, head: Secondary | ICD-10-CM

## 2012-08-27 DIAGNOSIS — M25439 Effusion, unspecified wrist: Secondary | ICD-10-CM

## 2012-08-27 NOTE — Patient Instructions (Addendum)
Sprain  A sprain is a tear in one of the strong, fibrous tissues that connect your bones (ligaments). The severity of the sprain depends on how much of the ligament is torn. The tear can be either partial or complete.  CAUSES   Often, sprains are a result of a fall or an injury. The force of the impact causes the fibers of your ligament to stretch beyond their normal length. This excess tension causes the fibers of your ligament to tear.  SYMPTOMS   You may have some loss of motion or increased pain within your normal range of motion. Other symptoms include:  · Bruising.  · Tenderness.  · Swelling.  DIAGNOSIS   In order to diagnose a sprain, your caregiver will physically examine you to determine how torn the ligament is. Your caregiver may also suggest an X-ray exam to make sure no bones are broken.  TREATMENT   If your ligament is only partially torn, treatment usually involves keeping the injured area in a fixed position (immobilization) for a short period. To do this, your caregiver will apply a bandage, cast, or splint to keep the area from moving until it heals. For a partially torn ligament, the healing process usually takes 2 to 3 weeks.  If your ligament is completely torn, you may need surgery to reconnect the ligament to the bone or to reconstruct the ligament. After surgery, a cast or splint may be applied and will need to stay on for 4 to 6 weeks while your ligament heals.  HOME CARE INSTRUCTIONS  · Keep the injured area elevated to decrease swelling.  · To ease pain and swelling, apply ice to your joint twice a day, for 2 to 3 days.  · Put ice in a plastic bag.  · Place a towel between your skin and the bag.  · Leave the ice on for 15 minutes.  · Only take over-the-counter or prescription medicine for pain as directed by your caregiver.  · Do not leave the injured area unprotected until pain and stiffness go away (usually 3 to 4 weeks).  · Do not allow your cast or splint to get wet. Cover your cast or  splint with a plastic bag when you shower or bathe. Do not swim.  · Your caregiver may suggest exercises for you to do during your recovery to prevent or limit permanent stiffness.  SEEK IMMEDIATE MEDICAL CARE IF:  · Your cast or splint becomes damaged.  · Your pain becomes worse.  MAKE SURE YOU:  · Understand these instructions.  · Will watch your condition.  · Will get help right away if you are not doing well or get worse.  Document Released: 02/05/2000 Document Revised: 05/02/2011 Document Reviewed: 02/19/2011  [employer]® Patient Information ©2014 [employer].

## 2012-08-27 NOTE — Progress Notes (Signed)
Subjective:     Patient ID: Sarah Pruitt, female   DOB: 10-03-1955, 57 y.o.   MRN: [id number]  HPI F/U wrist sprain: Patient has suffered a wrist sprain after she slipped on kitchen floor. XRAY resulted negative for fracture. She reports the injury is much better and only hurts when she is trying to carry something heavy or closing doors. She reports the swelling has receded. She has been taking her mobic twice a day as instructed and it controls her pain.  No fever,, swelling or pain currently.  Swelling mass/ face: She reports this has completely resolved. XRAY is negative for any acute process. She is inquiring on if she needs to see a dentist. 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 She does have "extra antibiotics at home." No fever, swelling or pain currently.   Review of Systems See above HPI    Objective:   Physical Exam BP 129/68  Pulse 48  Temp(Src) 98.2 F (36.8 C) (Oral)  Ht 5\' 1"  (1.549 m)  Wt 121 lb 9.6 oz (55.157 kg)  BMI 22.99 kg/m2 Gen: Pleasant. NAD HENT: AT. [place]. Jaw without swelling or erythema. Not tender. No signs of infection.   Wrist: Very Mild swelling present. No erythema or bruising. TTP over carpal tunnel, mildly tender over bony prominence bilateral. Pain elicited with extension, no pain with flexion. Patient able to touch her thumb to each finger tip without pain or complications.

## 2012-08-28 NOTE — Assessment & Plan Note (Signed)
-   Antibiotics have cleared all signs of infection.  - Panarex negative for abscess or acute process.  - Patient would like to see dentist and I believe she is in need of dental care, or this is likely to reoccur. 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 I gave her a list of the medicaid approved dentist from our office. I explained to her that it would not be covered by orange card, but they may work with her on price.

## 2012-08-28 NOTE — Assessment & Plan Note (Signed)
-   Remains mildly swollen. Explained to patient to continue current NSAID therapy and avoid lifting heavy objects with  That hand until symptoms improve. Explained wrist sprain can take 6-8 weeks to heal completely. Did not think that restricting movement (wrap) at this point would be beneficial.  - XRAY are normal.  - F/U: if she has further complications.

## 2012-09-17 ENCOUNTER — Ambulatory Visit
Admission: RE | Admit: 2012-09-17 | Discharge: 2012-09-17 | Disposition: A | Discharge status: Home / Self Care | Payer: PRIVATE HEALTH INSURANCE | Source: Ambulatory Visit

## 2012-09-17 DIAGNOSIS — Z1231 Encounter for screening mammogram for malignant neoplasm of breast: Secondary | ICD-10-CM

## 2012-09-26 ENCOUNTER — Ambulatory Visit (INDEPENDENT_AMBULATORY_CARE_PROVIDER_SITE_OTHER): Payer: PRIVATE HEALTH INSURANCE | Admitting: Family Medicine

## 2012-09-26 ENCOUNTER — Encounter: Payer: Self-pay | Admitting: Family Medicine

## 2012-09-26 VITALS — BP 150/74 | HR 71 | Temp 99.0°F | Ht 61.0 in | Wt 122.0 lb

## 2012-09-26 DIAGNOSIS — B078 Other viral warts: Secondary | ICD-10-CM

## 2012-09-26 DIAGNOSIS — I10 Essential (primary) hypertension: Secondary | ICD-10-CM

## 2012-09-26 DIAGNOSIS — K219 Gastro-esophageal reflux disease without esophagitis: Secondary | ICD-10-CM

## 2012-09-26 MED ORDER — SALICYLIC ACID 26 % EX LIQD: 2.0000 [drp] | Freq: Two times a day (BID) | CUTANEOUS | Status: DC

## 2012-09-26 MED ORDER — LISINOPRIL 20 MG PO TABS: 20.0000 mg | ORAL_TABLET | Freq: Two times a day (BID) | ORAL | Status: DC

## 2012-09-26 MED ORDER — MELOXICAM 7.5 MG PO TABS: 15.0000 mg | ORAL_TABLET | Freq: Every day | ORAL | Status: DC

## 2012-09-26 NOTE — Assessment & Plan Note (Signed)
Stable on PPI 

## 2012-09-26 NOTE — Assessment & Plan Note (Signed)
Re-wrote rx for appropriate lisinopril

## 2012-09-26 NOTE — Progress Notes (Signed)
  Subjective:    Patient ID: Sarah Pruitt, female    DOB: 1955-10-22, 57 y.o.   MRN: [id number]  HPI  F/u htn. Pharmacy only  Giving her enough meds for one a day of lisinopril even tho I wrote for #60. No headaches, no SOB, no chest pain.  F/u palpitations---doing well.  GERD controlled  Wart x 2 on left hand. Not painful.has been there a long time. F/u hand pain after fall--resolved except for very occasional sharp dorsal wrist pain lasting seconds.  F/u 2 episodes of cheek swelling--resolved Preventive needs: got her mammogram   Review of Systems See hpi    Objective:   Physical Exam  Vital signs reviewed. GENERAL: Well-developed, well-nourished, no acute distress. CARDIOVASCULAR: Regular rate and rhythm no murmur gallop or rub LUNGS: Clear to auscultation bilaterally, no rales or wheeze. ABDOMEN: Soft positive bowel sounds NEURO: No gross focal neurological deficits. MSK: Movement of extremity x 4.        Assessment & Plan:  She did not want to do repeat liquid nitro freezes because of time required to come for visits so we will try salacylic acid.rtc 3 m

## 2012-10-08 ENCOUNTER — Other Ambulatory Visit: Payer: Self-pay | Admitting: *Deleted

## 2012-10-08 NOTE — Telephone Encounter (Signed)
Also received refill request for Pepcid 20 mg #90--1 tab po BID--last refilled 12/24/11.  Gaylene Brooks, RN

## 2012-10-10 ENCOUNTER — Other Ambulatory Visit: Payer: Self-pay | Admitting: *Deleted

## 2012-10-10 MED ORDER — AMITRIPTYLINE HCL 75 MG PO TABS: ORAL_TABLET | ORAL | Status: DC

## 2012-10-10 MED ORDER — FAMOTIDINE 20 MG PO TABS: 20.0000 mg | ORAL_TABLET | Freq: Two times a day (BID) | ORAL | Status: DC

## 2012-10-10 MED ORDER — METOPROLOL TARTRATE 50 MG PO TABS: ORAL_TABLET | ORAL | Status: DC

## 2013-08-05 ENCOUNTER — Telehealth: Payer: Self-pay | Admitting: Family Medicine

## 2013-08-05 NOTE — Telephone Encounter (Signed)
Wants to talk about the girls and my mom having a shot

## 2014-04-23 ENCOUNTER — Ambulatory Visit (INDEPENDENT_AMBULATORY_CARE_PROVIDER_SITE_OTHER): Payer: 59 | Admitting: Family Medicine

## 2014-04-23 ENCOUNTER — Encounter: Payer: Self-pay | Admitting: Family Medicine

## 2014-04-23 VITALS — BP 169/84 | HR 68 | Temp 97.9°F | Ht 61.0 in | Wt 103.1 lb

## 2014-04-23 DIAGNOSIS — B1920 Unspecified viral hepatitis C without hepatic coma: Secondary | ICD-10-CM

## 2014-04-23 DIAGNOSIS — M25431 Effusion, right wrist: Secondary | ICD-10-CM

## 2014-04-23 DIAGNOSIS — E785 Hyperlipidemia, unspecified: Secondary | ICD-10-CM

## 2014-04-23 DIAGNOSIS — M189 Osteoarthritis of first carpometacarpal joint, unspecified: Secondary | ICD-10-CM

## 2014-04-23 DIAGNOSIS — M1812 Unilateral primary osteoarthritis of first carpometacarpal joint, left hand: Secondary | ICD-10-CM

## 2014-04-23 DIAGNOSIS — I10 Essential (primary) hypertension: Secondary | ICD-10-CM

## 2014-04-23 DIAGNOSIS — R634 Abnormal weight loss: Secondary | ICD-10-CM

## 2014-04-23 LAB — CBC WITH DIFFERENTIAL/PLATELET
BASOS ABS: 0 10*3/uL (ref 0.0–0.1)
Basophils Relative: 0 % (ref 0–1)
EOS PCT: 10 % — AB (ref 0–5)
Eosinophils Absolute: 0.6 10*3/uL (ref 0.0–0.7)
HCT: 39.1 % (ref 36.0–46.0)
HEMOGLOBIN: 12.7 g/dL (ref 12.0–15.0)
Lymphocytes Relative: 29 % (ref 12–46)
Lymphs Abs: 1.7 10*3/uL (ref 0.7–4.0)
MCH: 30 pg (ref 26.0–34.0)
MCHC: 32.5 g/dL (ref 30.0–36.0)
MCV: 92.2 fL (ref 78.0–100.0)
MPV: 9.4 fL (ref 8.6–12.4)
Monocytes Absolute: 0.4 10*3/uL (ref 0.1–1.0)
Monocytes Relative: 6 % (ref 3–12)
NEUTROS PCT: 55 % (ref 43–77)
Neutro Abs: 3.2 10*3/uL (ref 1.7–7.7)
Platelets: 220 10*3/uL (ref 150–400)
RBC: 4.24 MIL/uL (ref 3.87–5.11)
RDW: 13.9 % (ref 11.5–15.5)
WBC: 5.9 10*3/uL (ref 4.0–10.5)

## 2014-04-23 LAB — TSH: TSH: 0.797 u[IU]/mL (ref 0.350–4.500)

## 2014-04-23 MED ORDER — METHYLPREDNISOLONE ACETATE 40 MG/ML IJ SUSP
40.0000 mg | Freq: Once | INTRAMUSCULAR | Status: AC
  Administered 2014-04-23: 40 mg via INTRA_ARTICULAR

## 2014-04-23 MED ORDER — LISINOPRIL 20 MG PO TABS
20.0000 mg | ORAL_TABLET | Freq: Two times a day (BID) | ORAL | Status: DC
Start: 2014-04-23 — End: 2015-07-29

## 2014-04-23 MED ORDER — METOPROLOL TARTRATE 50 MG PO TABS: ORAL_TABLET | ORAL | Status: DC

## 2014-04-23 NOTE — Patient Instructions (Signed)
You have lost a lot of weight. I'm checking some blood work today. Normal to see you back in my office in the next 2-3 weeks to follow-up the weight loss and to follow-up her blood pressure. I'm giving prescriptions today for your blood pressure medicines and I want to start them now.

## 2014-04-24 ENCOUNTER — Encounter: Payer: Self-pay | Admitting: Family Medicine

## 2014-04-24 DIAGNOSIS — M189 Osteoarthritis of first carpometacarpal joint, unspecified: Secondary | ICD-10-CM | POA: Insufficient documentation

## 2014-04-24 LAB — COMPREHENSIVE METABOLIC PANEL
ALK PHOS: 75 U/L (ref 39–117)
ALT: 11 U/L (ref 0–35)
AST: 18 U/L (ref 0–37)
Albumin: 4.4 g/dL (ref 3.5–5.2)
BILIRUBIN TOTAL: 0.7 mg/dL (ref 0.2–1.2)
BUN: 15 mg/dL (ref 6–23)
CO2: 27 mEq/L (ref 19–32)
CREATININE: 0.81 mg/dL (ref 0.50–1.10)
Calcium: 9.3 mg/dL (ref 8.4–10.5)
Chloride: 106 mEq/L (ref 96–112)
Glucose, Bld: 99 mg/dL (ref 70–99)
Potassium: 4.2 mEq/L (ref 3.5–5.3)
SODIUM: 141 meq/L (ref 135–145)
TOTAL PROTEIN: 6.9 g/dL (ref 6.0–8.3)

## 2014-04-24 LAB — LDL CHOLESTEROL, DIRECT: LDL DIRECT: 161 mg/dL — AB

## 2014-04-24 LAB — HEPATITIS C RNA QUANTITATIVE: HCV Quantitative: NOT DETECTED IU/mL (ref <15)

## 2014-04-24 LAB — HIV ANTIBODY (ROUTINE TESTING W REFLEX): HIV 1&2 Ab, 4th Generation: NONREACTIVE

## 2014-04-24 NOTE — Assessment & Plan Note (Signed)
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 We'll check quantitative RNA.

## 2014-04-24 NOTE — Assessment & Plan Note (Signed)
Has been without blood pressure medicines for year. Restart her medications. Get lab work. See her back 3-4 weeks.

## 2014-04-24 NOTE — Assessment & Plan Note (Signed)
Corticosteroid injection today 

## 2014-04-24 NOTE — Progress Notes (Signed)
   Subjective:    Patient ID: Sarah Pruitt, female    DOB: 08/02/1955, 59 y.o.   MRN: [id number]  HPI  Patient presents after long absence from care for follow-up on a couple of issues #1. Unplanned weight loss. She's lost about 20 pounds in the last year. Feels like she is eating more than ever. Admits she's had increased thirst and increased urination. Denies increased stress. No change in bowel habits specifically no decrease in stool, no diarrhea, no blood in her stool. Denies abdominal pain. Has not really been more fatigued than usual. No unusual physical activity. #2. Has not taken her high blood pressure medicine for a year. Ditto regarding her cholesterol medicine. #3. Left thumb is bothering her quite a bit. She wants to consider an injection. Unable to do activities of daily living. Looking forward to this summer when she's going to be doing gardening.  Review of Systems See history of present illness    Objective:   Physical Exam  Vital signs reviewed GENERALl: Well developed, well nourished, in no acute distress. NECK: Supple, FROM, without lymphadenopathy.  THYROID: normal without nodularity CAROTID ARTERIES: without bruits LUNGS: clear to auscultation bilaterally. No wheezes or rales. HEART: Regular rate and rhythm, no murmurs ABDOMEN: soft with positive bowel sounds MSK: MOE x 4, left thumb has some limited range of motion in apposition, abduction. 10 palpation across the [facility]. There is some synovial hypertrophy. No erythema, no warmth. The rest of the wrist and hand are normal in exam. SKIN no rash NEURO: no focal deficits       Assessment & Plan:

## 2014-04-25 ENCOUNTER — Observation Stay (HOSPITAL_COMMUNITY)
Admission: EM | Admit: 2014-04-25 | Discharge: 2014-04-27 | Disposition: A | Discharge status: Home / Self Care | Payer: 59 | Attending: Family Medicine | Admitting: Family Medicine

## 2014-04-25 ENCOUNTER — Other Ambulatory Visit: Payer: Self-pay

## 2014-04-25 ENCOUNTER — Emergency Department (HOSPITAL_COMMUNITY): Payer: 59

## 2014-04-25 ENCOUNTER — Encounter (HOSPITAL_COMMUNITY): Payer: Self-pay

## 2014-04-25 DIAGNOSIS — R200 Anesthesia of skin: Secondary | ICD-10-CM | POA: Insufficient documentation

## 2014-04-25 DIAGNOSIS — G629 Polyneuropathy, unspecified: Secondary | ICD-10-CM | POA: Diagnosis not present

## 2014-04-25 DIAGNOSIS — R29810 Facial weakness: Secondary | ICD-10-CM | POA: Insufficient documentation

## 2014-04-25 DIAGNOSIS — H02401 Unspecified ptosis of right eyelid: Secondary | ICD-10-CM | POA: Diagnosis not present

## 2014-04-25 DIAGNOSIS — Z7952 Long term (current) use of systemic steroids: Secondary | ICD-10-CM | POA: Diagnosis not present

## 2014-04-25 DIAGNOSIS — R4182 Altered mental status, unspecified: Secondary | ICD-10-CM | POA: Diagnosis not present

## 2014-04-25 DIAGNOSIS — Z79899 Other long term (current) drug therapy: Secondary | ICD-10-CM | POA: Insufficient documentation

## 2014-04-25 DIAGNOSIS — R55 Syncope and collapse: Secondary | ICD-10-CM | POA: Diagnosis present

## 2014-04-25 DIAGNOSIS — Z792 Long term (current) use of antibiotics: Secondary | ICD-10-CM | POA: Diagnosis not present

## 2014-04-25 DIAGNOSIS — Z720 Tobacco use: Secondary | ICD-10-CM | POA: Diagnosis not present

## 2014-04-25 DIAGNOSIS — Z8613 Personal history of malaria: Secondary | ICD-10-CM | POA: Diagnosis not present

## 2014-04-25 DIAGNOSIS — Z8619 Personal history of other infectious and parasitic diseases: Secondary | ICD-10-CM | POA: Insufficient documentation

## 2014-04-25 DIAGNOSIS — Z791 Long term (current) use of non-steroidal anti-inflammatories (NSAID): Secondary | ICD-10-CM | POA: Insufficient documentation

## 2014-04-25 DIAGNOSIS — E440 Moderate protein-calorie malnutrition: Secondary | ICD-10-CM | POA: Insufficient documentation

## 2014-04-25 DIAGNOSIS — L400 Psoriasis vulgaris: Secondary | ICD-10-CM | POA: Diagnosis not present

## 2014-04-25 DIAGNOSIS — R531 Weakness: Secondary | ICD-10-CM | POA: Diagnosis present

## 2014-04-25 DIAGNOSIS — D649 Anemia, unspecified: Secondary | ICD-10-CM | POA: Insufficient documentation

## 2014-04-25 LAB — COMPREHENSIVE METABOLIC PANEL
ALBUMIN: 4.3 g/dL (ref 3.5–5.2)
ALT: 13 U/L (ref 0–35)
AST: 26 U/L (ref 0–37)
Alkaline Phosphatase: 75 U/L (ref 39–117)
Anion gap: 9 (ref 5–15)
BUN: 14 mg/dL (ref 6–23)
CO2: 26 mmol/L (ref 19–32)
Calcium: 9.4 mg/dL (ref 8.4–10.5)
Chloride: 105 mmol/L (ref 96–112)
Creatinine, Ser: 0.93 mg/dL (ref 0.50–1.10)
GFR calc Af Amer: 78 mL/min — ABNORMAL LOW (ref >90)
GFR calc non Af Amer: 67 mL/min — ABNORMAL LOW (ref >90)
Glucose, Bld: 175 mg/dL — ABNORMAL HIGH (ref 70–99)
POTASSIUM: 3.7 mmol/L (ref 3.5–5.1)
SODIUM: 140 mmol/L (ref 135–145)
TOTAL PROTEIN: 7.4 g/dL (ref 6.0–8.3)
Total Bilirubin: 1.1 mg/dL (ref 0.3–1.2)

## 2014-04-25 LAB — PROTIME-INR
INR: 1.02 (ref 0.00–1.49)
PROTHROMBIN TIME: 13.5 s (ref 11.6–15.2)

## 2014-04-25 LAB — I-STAT CHEM 8, ED
BUN: 15 mg/dL (ref 6–23)
CREATININE: 0.9 mg/dL (ref 0.50–1.10)
Calcium, Ion: 1.05 mmol/L — ABNORMAL LOW (ref 1.12–1.23)
Chloride: 102 mmol/L (ref 96–112)
GLUCOSE: 178 mg/dL — AB (ref 70–99)
HEMATOCRIT: 42 % (ref 36.0–46.0)
HEMOGLOBIN: 14.3 g/dL (ref 12.0–15.0)
Potassium: 3.6 mmol/L (ref 3.5–5.1)
SODIUM: 142 mmol/L (ref 135–145)
TCO2: 24 mmol/L (ref 0–100)

## 2014-04-25 LAB — DIFFERENTIAL
BASOS ABS: 0 10*3/uL (ref 0.0–0.1)
Basophils Relative: 0 % (ref 0–1)
Eosinophils Absolute: 0.4 10*3/uL (ref 0.0–0.7)
Eosinophils Relative: 6 % — ABNORMAL HIGH (ref 0–5)
Lymphocytes Relative: 41 % (ref 12–46)
Lymphs Abs: 2.6 10*3/uL (ref 0.7–4.0)
MONO ABS: 0.5 10*3/uL (ref 0.1–1.0)
Monocytes Relative: 8 % (ref 3–12)
NEUTROS ABS: 2.8 10*3/uL (ref 1.7–7.7)
Neutrophils Relative %: 45 % (ref 43–77)

## 2014-04-25 LAB — CBC
HCT: 38.4 % (ref 36.0–46.0)
HEMOGLOBIN: 13 g/dL (ref 12.0–15.0)
MCH: 30.8 pg (ref 26.0–34.0)
MCHC: 33.9 g/dL (ref 30.0–36.0)
MCV: 91 fL (ref 78.0–100.0)
Platelets: 230 10*3/uL (ref 150–400)
RBC: 4.22 MIL/uL (ref 3.87–5.11)
RDW: 13.5 % (ref 11.5–15.5)
WBC: 6.3 10*3/uL (ref 4.0–10.5)

## 2014-04-25 LAB — I-STAT TROPONIN, ED: Troponin i, poc: 0 ng/mL (ref 0.00–0.08)

## 2014-04-25 LAB — APTT: APTT: 29 s (ref 24–37)

## 2014-04-25 LAB — ETHANOL: Alcohol, Ethyl (B): <5 mg/dL (ref 0–9)

## 2014-04-25 MED ORDER — LORAZEPAM 2 MG/ML IJ SOLN
1.0000 mg | Freq: Once | INTRAMUSCULAR | Status: AC
Start: 2014-04-25 — End: 2014-04-25
  Administered 2014-04-25: 1 mg via INTRAVENOUS

## 2014-04-25 MED ORDER — LORAZEPAM 2 MG/ML IJ SOLN
INTRAMUSCULAR | Status: AC
Start: 2014-04-25 — End: 2014-04-26
  Filled 2014-04-25: qty 1

## 2014-04-25 NOTE — ED Notes (Signed)
Pt transported to MRI 

## 2014-04-25 NOTE — ED Notes (Signed)
Per EMS - pt eating dinner with family, fell over around 2215 and reported numbness all over. Pt had right eye droop and left gaze. Pt slurred speech, blurred/impaired vision. Pt initially hypertensive 220/118. 12-lead unremarkable.

## 2014-04-25 NOTE — ED Notes (Signed)
Code Stroke paged at 2251

## 2014-04-26 ENCOUNTER — Observation Stay (HOSPITAL_COMMUNITY): Payer: 59

## 2014-04-26 ENCOUNTER — Emergency Department (HOSPITAL_COMMUNITY): Payer: 59

## 2014-04-26 ENCOUNTER — Other Ambulatory Visit (HOSPITAL_COMMUNITY): Payer: 59

## 2014-04-26 DIAGNOSIS — E440 Moderate protein-calorie malnutrition: Secondary | ICD-10-CM | POA: Insufficient documentation

## 2014-04-26 DIAGNOSIS — R55 Syncope and collapse: Secondary | ICD-10-CM | POA: Diagnosis not present

## 2014-04-26 DIAGNOSIS — R531 Weakness: Principal | ICD-10-CM | POA: Insufficient documentation

## 2014-04-26 DIAGNOSIS — R4182 Altered mental status, unspecified: Secondary | ICD-10-CM

## 2014-04-26 DIAGNOSIS — H02401 Unspecified ptosis of right eyelid: Secondary | ICD-10-CM | POA: Diagnosis not present

## 2014-04-26 DIAGNOSIS — L400 Psoriasis vulgaris: Secondary | ICD-10-CM | POA: Diagnosis not present

## 2014-04-26 LAB — CREATININE, SERUM
CREATININE: 0.84 mg/dL (ref 0.50–1.10)
GFR calc Af Amer: 88 mL/min — ABNORMAL LOW (ref >90)
GFR, EST NON AFRICAN AMERICAN: 76 mL/min — AB (ref >90)

## 2014-04-26 LAB — CBC
HCT: 40 % (ref 36.0–46.0)
HEMOGLOBIN: 13.3 g/dL (ref 12.0–15.0)
MCH: 30.4 pg (ref 26.0–34.0)
MCHC: 33.3 g/dL (ref 30.0–36.0)
MCV: 91.3 fL (ref 78.0–100.0)
Platelets: 236 10*3/uL (ref 150–400)
RBC: 4.38 MIL/uL (ref 3.87–5.11)
RDW: 13.5 % (ref 11.5–15.5)
WBC: 6 10*3/uL (ref 4.0–10.5)

## 2014-04-26 LAB — URINALYSIS, ROUTINE W REFLEX MICROSCOPIC
Bilirubin Urine: NEGATIVE
GLUCOSE, UA: NEGATIVE mg/dL
HGB URINE DIPSTICK: NEGATIVE
Ketones, ur: NEGATIVE mg/dL
Leukocytes, UA: NEGATIVE
Nitrite: NEGATIVE
Protein, ur: NEGATIVE mg/dL
SPECIFIC GRAVITY, URINE: 1.008 (ref 1.005–1.030)
UROBILINOGEN UA: 0.2 mg/dL (ref 0.0–1.0)
pH: 7 (ref 5.0–8.0)

## 2014-04-26 LAB — RAPID URINE DRUG SCREEN, HOSP PERFORMED
Amphetamines: NOT DETECTED
BARBITURATES: NOT DETECTED
Benzodiazepines: NOT DETECTED
COCAINE: NOT DETECTED
Opiates: NOT DETECTED
TETRAHYDROCANNABINOL: NOT DETECTED

## 2014-04-26 LAB — SEDIMENTATION RATE: Sed Rate: 5 mm/hr (ref 0–22)

## 2014-04-26 LAB — CK: Total CK: 120 U/L (ref 7–177)

## 2014-04-26 LAB — TROPONIN I: Troponin I: <0.03 ng/mL (ref <0.031)

## 2014-04-26 LAB — VITAMIN B12: Vitamin B-12: 620 pg/mL (ref 211–911)

## 2014-04-26 LAB — TSH: TSH: 2.425 u[IU]/mL (ref 0.350–4.500)

## 2014-04-26 LAB — MAGNESIUM: MAGNESIUM: 2.3 mg/dL (ref 1.5–2.5)

## 2014-04-26 LAB — MRSA PCR SCREENING: MRSA by PCR: NEGATIVE

## 2014-04-26 LAB — PHOSPHORUS: Phosphorus: 4.4 mg/dL (ref 2.3–4.6)

## 2014-04-26 MED ORDER — ENSURE COMPLETE PO LIQD
237.0000 mL | Freq: Two times a day (BID) | ORAL | Status: DC
  Administered 2014-04-26 – 2014-04-27 (×3): 237 mL via ORAL

## 2014-04-26 MED ORDER — LISINOPRIL 20 MG PO TABS
20.0000 mg | ORAL_TABLET | Freq: Every day | ORAL | Status: DC
  Administered 2014-04-26 – 2014-04-27 (×2): 20 mg via ORAL
  Filled 2014-04-26 (×2): qty 1

## 2014-04-26 MED ORDER — INFLUENZA VAC SPLIT QUAD 0.5 ML IM SUSY
0.5000 mL | PREFILLED_SYRINGE | INTRAMUSCULAR | Status: AC
  Administered 2014-04-27: 0.5 mL via INTRAMUSCULAR
  Filled 2014-04-26: qty 0.5

## 2014-04-26 MED ORDER — CETYLPYRIDINIUM CHLORIDE 0.05 % MT LIQD: 7.0000 mL | Freq: Two times a day (BID) | OROMUCOSAL | Status: DC

## 2014-04-26 MED ORDER — HEPARIN SODIUM (PORCINE) 5000 UNIT/ML IJ SOLN
5000.0000 [IU] | Freq: Three times a day (TID) | INTRAMUSCULAR | Status: DC
  Administered 2014-04-26 – 2014-04-27 (×4): 5000 [IU] via SUBCUTANEOUS
  Filled 2014-04-26 (×4): qty 1

## 2014-04-26 MED ORDER — GADOBENATE DIMEGLUMINE 529 MG/ML IV SOLN
9.0000 mL | Freq: Once | INTRAVENOUS | Status: AC | PRN
  Administered 2014-04-26: 9 mL via INTRAVENOUS

## 2014-04-26 MED ORDER — PNEUMOCOCCAL VAC POLYVALENT 25 MCG/0.5ML IJ INJ
0.5000 mL | INJECTION | INTRAMUSCULAR | Status: AC
  Administered 2014-04-27: 0.5 mL via INTRAMUSCULAR
  Filled 2014-04-26: qty 0.5

## 2014-04-26 MED ORDER — CHLORHEXIDINE GLUCONATE 0.12 % MT SOLN
15.0000 mL | Freq: Two times a day (BID) | OROMUCOSAL | Status: DC
  Administered 2014-04-27: 15 mL via OROMUCOSAL
  Filled 2014-04-26 (×2): qty 15

## 2014-04-26 MED ORDER — SODIUM CHLORIDE 0.45 % IV SOLN
INTRAVENOUS | Status: DC
  Administered 2014-04-26 (×2): via INTRAVENOUS
  Administered 2014-04-27: 100 mL/h via INTRAVENOUS

## 2014-04-26 NOTE — ED Provider Notes (Addendum)
CSN: [id number]     Arrival date & time 04/25/14  2301 History   First MD Initiated Contact with Patient 04/25/14 2312     Chief Complaint  Patient presents with  . Code Stroke    @ (Consider location/radiation/quality/duration/timing/severity/associated sxs/prior Treatment) HPI  Patient initially evaluated by myself at 11:04 PM as a code stroke.  Per EMS report, patient was eating dinner with her family when she fell over at approximately 11:15 PM, 45 minutes prior to arrival. Patient reported numbness all over. She was noted by EMS to have right-sided facial droop and left gaze preference. Patient was mumbling. Noted to be hypertensive in route. On my initial assessment, patient is mumbling but protecting her airway.  She does not appear to respond to pain. Mild right-sided ptosis noted.  Patient cleared for CT scan  Level V caveat for altered mental status  Past Medical History  Diagnosis Date  . Allergy   . Hepatitis C   . Peripheral neuropathy     distal foot mostly (moderate), some hand (mild)  . Malaria 1977  . Anemia    Past Surgical History  Procedure Laterality Date  . Liver biopsy  2005   Family History  Problem Relation Age of Onset  . Hypertension Mother   . Schizophrenia Sister    History  Substance Use Topics  . Smoking status: Current Every Day Smoker  . Smokeless tobacco: Not on file  . Alcohol Use: No   OB History    No data available     Review of Systems  Unable to perform ROS: Acuity of condition      Allergies  Review of patient's allergies indicates no known allergies.  Home Medications   Prior to Admission medications   Medication Sig Start Date End Date Taking? Authorizing Provider  amitriptyline (ELAVIL) 75 MG tablet TAKE ONE TABLET BY MOUTH AT BEDTIME 10/10/12   Nestor Ramp, MD  amoxicillin-clavulanate (AUGMENTIN) 875-125 MG per tablet Take 1 tablet by mouth 2 (two) times daily. 06/28/12   Amber Nydia Bouton, MD  Clobetasol  Propionate (CLOBETASOL 17 PROPIONATE) 0.5 % POWD Apply a thick coat to your hands every night at bedtime 03/08/12   Nestor Ramp, MD  Clobetasol Propionate (CLOBETASOL 17 PROPIONATE) 0.5 % POWD Apply a small amount to the area on your neck three times a day and the area on your face once a day 03/08/12   Nestor Ramp, MD  clotrimazole (LOTRIMIN) 1 % cream Apply topically 2 (two) times daily.      Historical Provider, MD  famotidine (PEPCID) 20 MG tablet Take 1 tablet (20 mg total) by mouth 2 (two) times daily. 10/10/12   Nestor Ramp, MD  hydrocortisone 2.5 % cream Apply topically 2 (two) times daily.      Historical Provider, MD  lisinopril (PRINIVIL,ZESTRIL) 20 MG tablet Take 1 tablet (20 mg total) by mouth 2 (two) times daily. 04/23/14   Nestor Ramp, MD  loratadine (CLARITIN) 10 MG tablet Take 10 mg by mouth daily.      Historical Provider, MD  meloxicam (MOBIC) 7.5 MG tablet Take 2 tablets (15 mg total) by mouth daily. 09/26/12   Nestor Ramp, MD  metoprolol (LOPRESSOR) 50 MG tablet TAKE ONE TABLET BY MOUTH TWICE DAILY 04/23/14   Nestor Ramp, MD  omeprazole (PRILOSEC) 40 MG capsule Take 1 capsule (40 mg total) by mouth daily. 03/08/12   Nestor Ramp, MD  penicillin v potassium (VEETID) 250  MG tablet Take 1 tablet (250 mg total) by mouth 3 (three) times daily. 08/15/12   Jacquelyn A McGill, MD  Salicylic Acid 26 % LIQD Apply 2 drops topically 2 times daily at 12 noon and 4 pm. 09/26/12   Nestor Ramp, MD  simvastatin (ZOCOR) 40 MG tablet Take 1 tablet (40 mg total) by mouth at bedtime. 08/20/12 08/20/13  Nestor Ramp, MD   BP 160/71 mmHg  Pulse 53  Temp(Src)   Resp 19  SpO2 100% Physical Exam  Constitutional: No distress.  ABCs intact  HENT:  Head: Normocephalic and atraumatic.  Mild right-sided ptosis  Eyes: Pupils are equal, round, and reactive to light.  Patient uncooperative with extraocular movement testing  Neck: Neck supple.  Cardiovascular: Normal rate, regular rhythm and normal heart sounds.   No  murmur heard. Pulmonary/Chest: Effort normal and breath sounds normal. No respiratory distress. She has no wheezes.  Abdominal: Soft. There is no tenderness.  Musculoskeletal: She exhibits no edema.  Neurological: She is alert.  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    ED Course  Procedures (including critical care time) Labs Review Labs Reviewed  DIFFERENTIAL - Abnormal; Notable for the following:    Eosinophils Relative 6 (*)    All other components within normal limits  COMPREHENSIVE METABOLIC PANEL - Abnormal; Notable for the following:    Glucose, Bld 175 (*)    GFR calc non Af Amer 67 (*)    GFR calc Af Amer 78 (*)    All other components within normal limits  I-STAT CHEM 8, ED - Abnormal; Notable for the following:    Glucose, Bld 178 (*)    Calcium, Ion 1.05 (*)    All other components within normal limits  ETHANOL  PROTIME-INR  APTT  CBC  URINE RAPID DRUG SCREEN (HOSP PERFORMED)  URINALYSIS, ROUTINE W REFLEX MICROSCOPIC  I-STAT TROPOININ, ED  I-STAT TROPOININ, ED    Imaging Review Ct Head Wo Contrast  04/25/2014   CLINICAL DATA:  Code stroke. Felt weak, and fell to floor. Diffuse numbness, mumbling and left-sided gaze. Right-sided facial droop. Initial encounter.  EXAM: CT HEAD WITHOUT CONTRAST  TECHNIQUE: Contiguous axial images were obtained from the base of the skull through the vertex without intravenous contrast.  COMPARISON:  None.  FINDINGS: There is no evidence of acute infarction, mass lesion, or intra- or extra-axial hemorrhage on CT.  The posterior fossa, including the cerebellum, brainstem and fourth ventricle, is within normal limits. The third and lateral ventricles, and basal ganglia are  unremarkable in appearance. The cerebral hemispheres are symmetric in appearance, with normal gray-white differentiation. No mass effect or midline shift is seen.  There is no evidence of fracture; visualized osseous structures are unremarkable in appearance. The orbits are within normal limits. The paranasal sinuses and mastoid air cells are well-aerated. No significant soft tissue abnormalities are seen.  IMPRESSION: Unremarkable noncontrast CT of the head.  These results were called by telephone at the time of interpretation on 04/25/2014 at 11:22 pm to Dr. Elspeth Cho, who verbally acknowledged these results.   Electronically Signed   By: Roanna Raider M.D.   On: 04/25/2014 23:22   Mr Brain Wo Contrast  04/26/2014   CLINICAL DATA:  Recent fall, now with numbness, right  eye droop, and left TS, slurred speech.  EXAM: MRI HEAD WITHOUT CONTRAST  MRI CERVICAL SPINE WITHOUT CONTRAST  TECHNIQUE: Multiplanar, multiecho pulse sequences of the brain and surrounding structures, and cervical spine, to include the craniocervical junction and cervicothoracic junction, were obtained without intravenous contrast.  COMPARISON:  Prior CT from earlier the same day.  FINDINGS: MRI HEAD FINDINGS  Cerebral volume within normal limits for patient age. No significant white matter changes present. No mass lesion or midline shift. No hydrocephalus. No extra-axial fluid collection.  No abnormal foci of restricted diffusion to suggest acute intracranial infarct. Gray-white matter differentiation maintained. Normal intravascular flow voids present. No acute or chronic intracranial hemorrhage.  Mild cerebellar tonsillar ectopia of approximately 3 mm present at the craniocervical junction without frank Chiari malformation.  Pituitary gland normal.  No acute abnormality seen about the orbits.  Paranasal sinuses are clear.  No mastoid effusion.  Bone marrow signal intensity normal. Scalp soft tissues within normal limits.  MRI CERVICAL SPINE  FINDINGS  Vertebral bodies are normally aligned with preservation of the normal cervical lordosis. Vertebral body heights are preserved. No fracture or listhesis. Signal intensity within the vertebral body bone marrow is normal. No focal osseous lesion.  Signal intensity within the cervical spinal cord is normal.  Paraspinous soft tissues within normal limits. Normal intravascular flow voids present within the vertebral arteries bilaterally.  At C4-5, there is a tiny central disc protrusion without significant stenosis.  At C6-7, there is diffuse disc bulge with mild bilateral uncovertebral spurring. Disc bulging is slightly centric to the right. There is resultant mild to moderate bilateral foraminal stenosis, slightly worse on the left. Posterior disc bulge mildly indents and flattens the ventral thecal sac and results in mild canal stenosis.  No other significant degenerative changes identified within the cervical spine. Visualized upper thoracic spine within normal limits.  IMPRESSION: MRI HEAD IMPRESSION:  1. No acute intracranial infarct or other abnormality identified. 2. Mild cerebellar tonsillar ectopia of approximately 3 mm without frank Chiari malformation. 3. Otherwise unremarkable MRI of the brain.  MRI CERVICAL SPINE IMPRESSION:  1. No acute traumatic injury within the cervical spine 2. Degenerative disc bulge with bilateral uncovertebral spurring at C6-7 with resultant mild canal and mild to moderate bilateral foraminal stenosis, slightly worse on the left. 3. Tiny central disc protrusion at C4-5 without significant stenosis. 4. Otherwise unremarkable MRI of the cervical spine.   Electronically Signed   By: Rise Mu M.D.   On: 04/26/2014 01:40   Mr Cervical Spine Wo Contrast  04/26/2014   CLINICAL DATA:  Recent fall, now with numbness, right eye droop, and left TS, slurred speech.  EXAM: MRI HEAD WITHOUT CONTRAST  MRI CERVICAL SPINE WITHOUT CONTRAST  TECHNIQUE: Multiplanar, multiecho  pulse sequences of the brain and surrounding structures, and cervical spine, to include the craniocervical junction and cervicothoracic junction, were obtained without intravenous contrast.  COMPARISON:  Prior CT from earlier the same day.  FINDINGS: MRI HEAD FINDINGS  Cerebral volume within normal limits for patient age. No significant white matter changes present. No mass lesion or midline shift. No hydrocephalus. No extra-axial fluid collection.  No abnormal foci of restricted diffusion to suggest acute intracranial infarct. Gray-white matter differentiation maintained. Normal intravascular flow voids present. No acute or chronic intracranial hemorrhage.  Mild cerebellar tonsillar ectopia of approximately 3 mm present at the craniocervical junction without frank Chiari malformation.  Pituitary gland normal.  No acute abnormality seen about the orbits.  Paranasal sinuses are  clear.  No mastoid effusion.  Bone marrow signal intensity normal. Scalp soft tissues within normal limits.  MRI CERVICAL SPINE FINDINGS  Vertebral bodies are normally aligned with preservation of the normal cervical lordosis. Vertebral body heights are preserved. No fracture or listhesis. Signal intensity within the vertebral body bone marrow is normal. No focal osseous lesion.  Signal intensity within the cervical spinal cord is normal.  Paraspinous soft tissues within normal limits. Normal intravascular flow voids present within the vertebral arteries bilaterally.  At C4-5, there is a tiny central disc protrusion without significant stenosis.  At C6-7, there is diffuse disc bulge with mild bilateral uncovertebral spurring. Disc bulging is slightly centric to the right. There is resultant mild to moderate bilateral foraminal stenosis, slightly worse on the left. Posterior disc bulge mildly indents and flattens the ventral thecal sac and results in mild canal stenosis.  No other significant degenerative changes identified within the cervical  spine. Visualized upper thoracic spine within normal limits.  IMPRESSION: MRI HEAD IMPRESSION:  1. No acute intracranial infarct or other abnormality identified. 2. Mild cerebellar tonsillar ectopia of approximately 3 mm without frank Chiari malformation. 3. Otherwise unremarkable MRI of the brain.  MRI CERVICAL SPINE IMPRESSION:  1. No acute traumatic injury within the cervical spine 2. Degenerative disc bulge with bilateral uncovertebral spurring at C6-7 with resultant mild canal and mild to moderate bilateral foraminal stenosis, slightly worse on the left. 3. Tiny central disc protrusion at C4-5 without significant stenosis. 4. Otherwise unremarkable MRI of the cervical spine.   Electronically Signed   By: Rise MuBenjamin  McClintock M.D.   On: 04/26/2014 01:40     EKG Interpretation EKG independently reviewed by myself: Normal sinus rhythm with a rate of 67, no evidence of ST elevation or ischemia, no evidence of arrhythmia or QT prolongation      MDM   Final diagnoses:  Weakness  Syncope, unspecified syncope type   Patient presents as a code stroke. Noted to be altered on exam. She also has an inconsistent neurologic exam. CT negative for acute stroke. MRI of brain and C-spine negative at this time. C-collar in place given history of fall and what appears to be neurologic deficit.  Workup is largely reassuring including troponins, basic labwork, urine.  Family is now the bedside. They state the patient has not been feeling well in general and "just collapse." She was seen by her primary physician yesterday and there was no mention of syncope at that time.  On reevaluation, she is not back to her baseline and neurologic exam continues to be inconsistent. At times she will follow commands and move her lower extremities.  Patient will require further workup. Neurology consulted and recommends further MRI with contrast to rule out transverse myelitis. Patient admitted to family medicine.   Shon Batonourtney F  Horton, MD 04/26/14 [id number]  Shon Batonourtney F Horton, MD 04/26/14 [id number]

## 2014-04-26 NOTE — ED Notes (Signed)
C-collar placed on pt.

## 2014-04-26 NOTE — Progress Notes (Signed)
OT Cancellation Note  Patient Details Name: Sarah Pruitt MRN: [id number] DOB: 08/04/1955   Cancelled Treatment:    Reason Eval/Treat Not Completed: Other (comment). Pt on strict bedrest. Will try back later today if activity orders are increased.  Evette GeorgesLeonard, Jakaiden Fill [name] [phone] 04/26/2014, 11:59 AM

## 2014-04-26 NOTE — Progress Notes (Signed)
Subjective: Back to baseline.   Exam: Filed Vitals:   04/26/14 1144  BP:   Pulse:   Temp: 97.4 F (36.3 C)  Resp:    Gen: In bed, NAD MS: awake, alert, oriented and appropriate ZO:XWRUECN:PERRL, VFF Motor: 5/5 thorughout Sensory:intact to LT  Pertinent Labs: cmp -unremarkable.   Impression: 59 yo F who reports rapid heart rate followed by presyncope with total body numbness and weakness. She denies actual LOC today. She does report LOC a few days ago. She has a history of similar events lasting months 20 years ago which were called seizures. The symptoms of needing total care for months would not be consistent with seizures. Given total body involvement without LOC, it would be strange for this to represent seizure either.   An EEG unfortunately will not be able to be performed this weekend, but could be done as an outpatient since she is back to baseline. As said above, I think seizure is low in the differential.   Recommendations: 1) Agree with cardiac syncope workup per primary team.  2) Neurology to sign off, please call with any further questions or concerns.   Ritta SlotMcNeill Khameron Gruenwald, MD [facility] [phone]  If 7pm- 7am, please page neurology on call as listed in AMION.

## 2014-04-26 NOTE — Evaluation (Signed)
Occupational Therapy Evaluation and Discharge Patient Details Name: Sarah Pruitt MRN: [id number] DOB: 1955/11/16 Today's Date: 04/26/2014    History of Present Illness Generalized weakness and AMS in setting of syncopal episode. PMHx: malaria, peripherial neuropathy, hep C   Clinical Impression   This 59 yo female admitted with above presents to acute OT today at a S level for BADLs/IADLs only due to her recent two episodes of syncope. No further OT needs, we will sign off.    Follow Up Recommendations  No OT follow up    Equipment Recommendations  None recommended by OT       Precautions / Restrictions Precautions Precautions: Fall (due to syncope) Restrictions Weight Bearing Restrictions: No      Mobility Bed Mobility Overal bed mobility: Independent                Transfers                 General transfer comment: S only due to 2 recent syncopal episodes         ADL                                         General ADL Comments: Just needs S due to h/o of syncopal episodes               Pertinent Vitals/Pain Pain Assessment: No/denies pain     Hand Dominance Right   Extremity/Trunk Assessment Upper Extremity Assessment Upper Extremity Assessment: Overall WFL for tasks assessed           Communication Communication Communication: No difficulties   Cognition Arousal/Alertness: Awake/alert Behavior During Therapy: WFL for tasks assessed/performed Overall Cognitive Status: Within Functional Limits for tasks assessed                                Home Living Family/patient expects to be discharged to:: Private residence Living Arrangements: Parent;Other relatives Available Help at Discharge: Family Type of Home: House Home Access: Stairs to enter [employer] of Steps: 3 Entrance Stairs-Rails: None Home Layout: One level     Bathroom Shower/Tub: Tub/shower unit;Door Shower/tub  characteristics: Teacher, early years/pre: None   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      Prior Functioning/Environment Level of Independence: Independent             OT Diagnosis: Generalized weakness         OT Goals(Current goals can be found in the care plan section) Acute Rehab OT Goals Patient Stated Goal: wants to go home  OT Frequency:                End of Session    Activity Tolerance:  (Pt reports she just feels tired) Patient left: in chair   Time: 1242-1301 OT Time Calculation (min): 19 min Charges:  OT General Charges $OT Visit: 1 Procedure OT Evaluation $Initial OT Evaluation Tier I: 1 Procedure G-Codes: OT G-codes **NOT FOR INPATIENT CLASS** Functional Assessment Tool Used: Clinical observation Functional Limitation: Self care Self Care Current Status (J1914): At least 1 percent but less than 20 percent impaired, limited or restricted Self Care Goal Status (N8295): At least 1 percent but less than 20 percent impaired, limited or restricted Self Care Discharge Status [phone]): At  least 1 percent but less than 20 percent impaired, limited or restricted  [name],[name] [name] [phone] 04/26/2014, 1:45 PM

## 2014-04-26 NOTE — ED Notes (Signed)
Pt transported to MRI - will call when MRI is finished to transport to floor.

## 2014-04-26 NOTE — ED Notes (Signed)
Neurology at bedside.

## 2014-04-26 NOTE — ED Notes (Signed)
MD at bedside. 

## 2014-04-26 NOTE — Consult Note (Signed)
Consult Reason for Consult: acute unresponsiveness Referring Physician: Dr Dina Rich  CC: generalized numbness and weakness  HPI: Sarah Pruitt is an 59 y.o. female with history of Hepatitis C presenting as code stroke. Per EMS patient was eating dinner with her family when she acutely fell over (unclear if LOC). At that time patient was reporting generalized numbness. Upon arrival, EMS noted a right facial droop and left gaze preference. They note she was mumbling but was able to converse. She was noted to be hypertensive en-route with BP of 220/118.   Per family, she was in her normal state of health. Stood up from dinner table, took 2 steps and then collapsed. They deny LOC but state she was disoriented and mumbling her words after the event. She was unable to stand on her own. Family denies any recent fevers, illnesses, no prior history. They report she has history of depression and has been under a lot of stress recently.   Patient taken for acute brain MRI and MRI C spine for concern of acute stroke vs cord compression. Imaging reviewed, no acute process noted.   Past Medical History  Diagnosis Date  . Allergy   . Hepatitis C   . Peripheral neuropathy     distal foot mostly (moderate), some hand (mild)  . Malaria 1977  . Anemia     Past Surgical History  Procedure Laterality Date  . Liver biopsy  2005    Family History  Problem Relation Age of Onset  . Hypertension Mother   . Schizophrenia Sister     Social History:  reports that she has been smoking.  She does not have any smokeless tobacco history on file. She reports that she does not drink alcohol or use illicit drugs.  No Known Allergies  Medications: I have reviewed the patient's current medications.  ROS: Out of a complete 14 system review, the patient complains of only the following symptoms, and all other reviewed systems are negative. Unable to obtain  Physical Examination: Filed Vitals:   04/26/14 0045   BP: 160/71  Pulse: 53  Resp: 19   Physical Exam  Constitutional: She appears well-developed and well-nourished.  Psych: Affect appropriate to situation Eyes: No scleral injection HENT: No OP obstrucion Head: Normocephalic.  Cardiovascular: Normal rate and regular rhythm.  Respiratory: Effort normal and breath sounds normal.  GI: Soft. Bowel sounds are normal. No distension. There is no tenderness.  Skin: marked psoriasis over bilateral knees  Neurologic Examination Mental Status: Lethargic but will open eyes to verbal stimuli. Intermittently makes eye contact. Minimal verbal output but able to tell me her name, location and correct date. Will follow simple commands (open/close eyes, squeeze fingers, show thumb).  Cranial Nerves: II: unable to visualize fundi, blinks to threat bilaterally, pupils equal, round, reactive to light  III,IV, VI: mild ptosis right eye, eyes midline, no gaze deviation noted, will intermittently track examiner across the room V,VII: face grossly symmetric, facial light touch sensation normal bilaterally VIII: unable to test IX,X: gag reflex present XI: unable to test XQJ:JHERDE to test Motor: No spontaneous movement. Flaccid proximal weakness in all 4 extremities. To command will squeeze bilateral fingers and intermittently will flex right forearm. Intermittently will wiggle bilateral toes to command.  Sensory: no withdrawal to noxious stimuli in all 4 extremities Deep Tendon Reflexes: 2+ and symmetric throughout Plantars: Right: downgoing   Left: downgoing Cerebellar: Unable to test Gait: unable to test  Laboratory Studies:   Basic Metabolic Panel:  Recent Labs  Lab 04/23/14 1030 04/25/14 2306 04/25/14 2312  NA 141 140 142  K 4.2 3.7 3.6  CL 106 105 102  CO2 27 26  --   GLUCOSE 99 175* 178*  BUN $Re'15 14 15  'HaL$ CREATININE 0.81 0.93 0.90  CALCIUM 9.3 9.4  --     Liver Function Tests:  Recent Labs Lab 04/23/14 1030 04/25/14 2306  AST  18 26  ALT 11 13  ALKPHOS 75 75  BILITOT 0.7 1.1  PROT 6.9 7.4  ALBUMIN 4.4 4.3   No results for input(s): LIPASE, AMYLASE in the last 168 hours. No results for input(s): AMMONIA in the last 168 hours.  CBC:  Recent Labs Lab 04/23/14 1030 04/25/14 2306 04/25/14 2312  WBC 5.9 6.3  --   NEUTROABS 3.2 2.8  --   HGB 12.7 13.0 14.3  HCT 39.1 38.4 42.0  MCV 92.2 91.0  --   PLT 220 230  --     Cardiac Enzymes: No results for input(s): CKTOTAL, CKMB, CKMBINDEX, TROPONINI in the last 168 hours.  BNP: Invalid input(s): POCBNP  CBG: No results for input(s): GLUCAP in the last 168 hours.  Microbiology: No results found for this or any previous visit.  Coagulation Studies:  Recent Labs  04/25/14 2306  LABPROT 13.5  INR 1.02    Urinalysis: No results for input(s): COLORURINE, LABSPEC, PHURINE, GLUCOSEU, HGBUR, BILIRUBINUR, KETONESUR, PROTEINUR, UROBILINOGEN, NITRITE, LEUKOCYTESUR in the last 168 hours.  Invalid input(s): APPERANCEUR  Lipid Panel:     Component Value Date/Time   CHOL 168 06/02/2010 0937   TRIG 48 06/02/2010 0937   HDL 66 06/02/2010 0937   CHOLHDL 2.5 06/02/2010 0937   VLDL 10 06/02/2010 0937   LDLCALC 92 06/02/2010 0937    HgbA1C: No results found for: HGBA1C  Urine Drug Screen:  No results found for: LABOPIA, COCAINSCRNUR, LABBENZ, AMPHETMU, THCU, LABBARB  Alcohol Level:  Recent Labs Lab 04/25/14 2306  ETH <5     Imaging: Ct Head Wo Contrast  04/25/2014   CLINICAL DATA:  Code stroke. Felt weak, and fell to floor. Diffuse numbness, mumbling and left-sided gaze. Right-sided facial droop. Initial encounter.  EXAM: CT HEAD WITHOUT CONTRAST  TECHNIQUE: Contiguous axial images were obtained from the base of the skull through the vertex without intravenous contrast.  COMPARISON:  None.  FINDINGS: There is no evidence of acute infarction, mass lesion, or intra- or extra-axial hemorrhage on CT.  The posterior fossa, including the cerebellum,  brainstem and fourth ventricle, is within normal limits. The third and lateral ventricles, and basal ganglia are unremarkable in appearance. The cerebral hemispheres are symmetric in appearance, with normal gray-white differentiation. No mass effect or midline shift is seen.  There is no evidence of fracture; visualized osseous structures are unremarkable in appearance. The orbits are within normal limits. The paranasal sinuses and mastoid air cells are well-aerated. No significant soft tissue abnormalities are seen.  IMPRESSION: Unremarkable noncontrast CT of the head.  These results were called by telephone at the time of interpretation on 04/25/2014 at 11:22 pm to Dr. Jim Like, who verbally acknowledged these results.   Electronically Signed   By: Garald Balding M.D.   On: 04/25/2014 23:22     Assessment/Plan:  58y/o woman with history of Hep C presenting with acute onset of generalized weakness and altered mental status. Code stroke activated. MRI brain DWI negative. MRI C spine without contrast negative. Unclear etiology of symptoms. Broad differential includes autoimmune vs toxic/metabolic vs infectious. Due to  inconsistencies on exam cannot rule out a functional component though this will be diagnosis of exclusion  -check MRI brain and C spine with contrast -check TSH, CK, ANA, magnesium, ESR, Lyme, Serum-NMO, B12, HIV, RPR -Rehab evaluation -EEG -further workup and treatment pending above results   Jim Like, DO [facility] [phone]  If 7pm- 7am, please page neurology on call as listed in AMION. 04/26/2014, 1:20 AM

## 2014-04-26 NOTE — Progress Notes (Signed)
Utilization review completed.  P.J. Paitynn Mikus,RN,BSN Case Manager 

## 2014-04-26 NOTE — Progress Notes (Signed)
Code Stroke called on 59 y.o female. At 2215 tonight while eating dinner with family pt fell over and complained of numbness all over per EMS. No family at bedside at this time. Unclear if she complained of numbness prior to fall or after fall. Pt taken to CT stat, unremarkable for acute process per Neurologist. NIHSS completed, yielding 20. Pt lethargic, unable to answer month or age, responds to yes or no questions only. Mild gaze to left side but can be seen tracking around the room at times. Weakness noted in bilateral arms with no movement seen in legs. See neuro flow sheet for details. 1 mg ativan given in ED room for possible seizure activity, C collar placed on neck. Pt taken to MRI stat. Per neurology no acute results on MRI. Per Neurologist note family came to bedside later this evening. Family admits patient has hx of depression and has been under a lot of stress lately. Prior to arrival to ED pt stood from table, took 2 steps and collapsed. Pt pending admit for further workup, for MRI tonight.

## 2014-04-26 NOTE — Evaluation (Signed)
Clinical/Bedside Swallow Evaluation Patient Details  Name: Sarah Pruitt MRN: [id number] Date of Birth: 17-Apr-1955  Today's Date: 04/26/2014 Time: SLP Start Time (ACUTE ONLY): 0920 SLP Stop Time (ACUTE ONLY): 0933 SLP Time Calculation (min) (ACUTE ONLY): 13 min  Past Medical History:  Past Medical History  Diagnosis Date  . Allergy   . Hepatitis C   . Peripheral neuropathy     distal foot mostly (moderate), some hand (mild)  . Malaria 1977  . Anemia    Past Surgical History:  Past Surgical History  Procedure Laterality Date  . Liver biopsy  2005   HPI:  Sarah Pruitt is a 59 y.o. female presenting with syncopal episode with collapse and AMS. PMH is significant for HCV, GERD, HTN. Unsure of eitology of syncopal episodes. No loss of consciousness during event. Code stroke was called in ED. CT and MRI negative.Broad differential at this time including infectious(no signs of infection on vitals or labs) vs autoimmune (Hx of psoriasis) vs toxin (UDS wnl) vs psychological. further w/u pending.    Assessment / Plan / Recommendation Clinical Impression  Patient presents with a functional oropharyngeal swallow without observed evidence of aspiration. Does endorse globus sensation periodically which is likely attributed to h/o GER (patient was on PPI but reports not longer taking due to inability to afford med). Defer addressing of GER meds to MD. No further SLP f/u indicated at this time.        Diet Recommendation Regular;Thin liquid   Liquid Administration via: Cup;Straw Medication Administration: Whole meds with liquid Supervision: Patient able to self feed Compensations: Slow rate;Small sips/bites Postural Changes and/or Swallow Maneuvers: Seated upright 90 degrees;Upright 30-60 min after meal    Other  Recommendations Oral Care Recommendations: Oral care BID   Follow Up Recommendations  None               Swallow Study    General HPI: Sarah Pruitt is a 59 y.o. female  presenting with syncopal episode with collapse and AMS. PMH is significant for HCV, GERD, HTN. Unsure of eitology of syncopal episodes. No loss of consciousness during event. Code stroke was called in ED. CT and MRI negative.Broad differential at this time including infectious(no signs of infection on vitals or labs) vs autoimmune (Hx of psoriasis) vs toxin (UDS wnl) vs psychological. further w/u pending.  Type of Study: Bedside swallow evaluation Previous Swallow Assessment: none Diet Prior to this Study: NPO Temperature Spikes Noted: No Respiratory Status: Room air History of Recent Intubation: No Behavior/Cognition: Alert;Cooperative;Pleasant mood Oral Cavity - Dentition: Adequate natural dentition Self-Feeding Abilities: Able to feed self Patient Positioning: Upright in bed Baseline Vocal Quality: Clear;Low vocal intensity Volitional Cough: Strong Volitional Swallow: Able to elicit    Oral/Motor/Sensory Function Overall Oral Motor/Sensory Function: Appears within functional limits for tasks assessed   Ice Chips Ice chips: Not tested   Thin Liquid Thin Liquid: Within functional limits Presentation: Cup;Self Fed;Straw    Nectar Thick Nectar Thick Liquid: Not tested   Honey Thick Honey Thick Liquid: Not tested   Puree Puree: Within functional limits Presentation: Self Fed;Spoon   Solid   GO Functional Assessment Tool Used: skilled clinical judgement Functional Limitations: Swallowing Swallow Current Status (W0981): At least 1 percent but less than 20 percent impaired, limited or restricted Swallow Goal Status [id number]): At least 1 percent but less than 20 percent impaired, limited or restricted Swallow Discharge Status [phone]): At least 1 percent but less than 20 percent impaired, limited or restricted  Solid: Within  functional limits Presentation: Self Fed      Thelia Tanksley MA, CCC-SLP [phone]  Sarah Pruitt 04/26/2014,9:43 AM

## 2014-04-26 NOTE — Discharge Summary (Signed)
Sarah Pruitt Discharge Summary  Patient name: Sarah Pruitt Medical record number: [id number] Date of birth: 1955-05-04 Age: 59 y.o. Gender: female Date of Admission: 04/25/2014  Date of Discharge: 04/27/2014 Admitting Physician: Sarah Dawn, MD  Primary Care Provider: Dorcas Mcmurray, MD Consultants: Neuro  Indication for Hospitalization: Syncope, AMS, generalized weakness  Discharge Diagnoses/Problem List:  Patient Active Problem List   Diagnosis Date Noted  . Syncope and collapse 04/26/2014  . Malnutrition of moderate degree 04/26/2014  . Altered mental status   . Weakness   . 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 Swelling of joint, wrist, right 08/15/2012  . Swelling, mass, or lump on face 06/28/2012  . Eczema 07/07/2010  . Allergic rhinitis 06/03/2010  . Chronic lower back pain 06/03/2010  . GERD (gastroesophageal reflux disease) 06/03/2010  . Hepatitis C 06/03/2010  . Single cyst of left breast 06/03/2010  . Heart palpitations 06/02/2010  . Neuropathy, peripheral 06/02/2010  . Hypertension 06/02/2010  . Hyperlipidemia 06/02/2010   Disposition: Home  Discharge Condition: Improved  Discharge Exam:  Filed Vitals:   04/27/14 0400  BP: 107/66  Pulse: 59  Temp: 97.8 F (36.6 C)  Resp: 18   General: Pleasant female, no acute distress, cooperative Cardiac: Bradycardic, S1 and S2 present, no murmurs, no heaves or thrills Respiratory: Clear to ascultation bilaterally, normal effort Abdomen: Soft, nontender, bowel sounds present, no rebound or guarding Extremities: No edema, 2+ radial and dorsalis pedis pulses bilaterally Skin: Psoriatic patches over bilateral anterior knees Neuro: CN grossly intact, no focal deficits, strength 5/5  Brief Pruitt Course:  Sarah Pruitt is a 59 y.o. female who presented with syncope, AMS, and generalized weakness. PMH is significant for hepatitis C, hypertension, psoriasis, GERD, heart palpitations,  and hyperlipidemia.  Syncope with collapse: Presented with syncopal episode and generalized weakness in setting of elevated BP. Also had associated AMS. At time of admission code stroke was called, however CT and MRI negative for any intracranial pathology. Patient had work up that was unremarkable. Patient recently restarted on BP medications by PCP at office visit 04/24/2014. BB started and patient with bradycardia during hospitalozation. EKG with LVH otherwise unremarkable and cardiac enzymes negative. Echo obtained prior to discharge; follow-up read. Neuro was consulted and did not believe this was due to stroke or seizure activity. PT/OT consulted and had no recommendations. Patient did have positive orthostatic vital signs. Believe this event was due to orthostatic hypotension vs neurocardiogenic (vasovagal) vs psychogenic/conversion disorder (recent stressors and endorses depressive symptoms). Patient returned back to baseline within 24 hrs of admission.  HTN: Per family, BP at home 198/104. Fluctuating pressures in Pruitt. BB was held due to bradycardia but continued Lisinopril.    The patient's other chronic conditions, including psoriasis, HLD, and HCV were stable during this hospitalization.    Issues for Follow Up:  1. Monitor BPs. Held BB in setting of syncope and bradycardia 2. Consider starting patient on antidepressant medication 3. Evaluate for need of EEG; patient with remote hx of seizures per patient 4. F/u on Echo results  Significant Procedures: None  Significant Labs and Imaging:   Recent Labs Lab 04/23/14 1030 04/25/14 2306 04/25/14 2312 04/26/14 0635  WBC 5.9 6.3  --  6.0  HGB 12.7 13.0 14.3 13.3  HCT 39.1 38.4 42.0 40.0  PLT 220 230  --  236    Recent Labs Lab 04/23/14 1030 04/25/14 2306 04/25/14 2312 04/26/14 0635  NA 141 140 142  --   K 4.2  3.7 3.6  --   CL 106 105 102  --   CO2 27 26  --   --   GLUCOSE 99 175* 178*  --   BUN $Re'15 14 15  'NKB$ --    CREATININE 0.81 0.93 0.90 0.84  CALCIUM 9.3 9.4  --   --   MG  --   --   --  2.3  PHOS  --   --   --  4.4  ALKPHOS 75 75  --   --   AST 18 26  --   --   ALT 11 13  --   --   ALBUMIN 4.4 4.3  --   --    TSH 2.425 CK 120 Mg 2.3 Phos 4.4 ESR 5  Results/Tests Pending at Time of Discharge: Echo  Discharge Medications:    Medication List    STOP taking these medications        metoprolol 50 MG tablet  Commonly known as:  LOPRESSOR      TAKE these medications        lisinopril 20 MG tablet  Commonly known as:  PRINIVIL,ZESTRIL  Take 1 tablet (20 mg total) by mouth 2 (two) times daily.        Discharge Instructions: Please refer to Patient Instructions section of EMR for full details.  Patient was counseled important signs and symptoms that should prompt return to medical care, changes in medications, dietary instructions, activity restrictions, and follow up appointments.   Follow-Up Appointments: Follow-up Information    Follow up with Sarah Mcmurray, MD On 04/30/2014.   Specialties:  Family Medicine, Sports Medicine   Why:  $Rem'@11am'rNRd$  for hosp f/u   Contact information:   [address] [address] [place], DO 04/27/2014, 7:02 AM PGY-1, [place]

## 2014-04-26 NOTE — Evaluation (Signed)
Physical Therapy Evaluation and DISCHARGE Patient Details Name: Sarah Pruitt MRN: [id number] DOB: 18-Dec-1955 Today's Date: 04/26/2014   History of Present Illness  Generalized weakness and AMS in setting of syncopal episode. PMHx: malaria, peripherial neuropathy, hep C  Clinical Impression  Pt is at or approaching baseline functioning.  Gait/mobility is stability and safe.  No further PT needs.  Will sign off.    Follow Up Recommendations No PT follow up    Equipment Recommendations  None recommended by PT    Recommendations for Other Services       Precautions / Restrictions Precautions Precautions: Fall Restrictions Weight Bearing Restrictions: No      Mobility  Bed Mobility Overal bed mobility: Independent                Transfers Overall transfer level: Independent               General transfer comment: S only due to 2 recent syncopal episodes  Ambulation/Gait Ambulation/Gait assistance: Supervision Ambulation Distance (Feet): 400 Feet Assistive device: None Gait Pattern/deviations: Step-through pattern Gait velocity: can increase speed appreciably, but prefers slower Gait velocity interpretation: at or above normal speed for age/gender General Gait Details: generally steady.  Stairs Stairs: Yes Stairs assistance: Supervision Stair Management: No rails;Alternating pattern;Forwards Number of Stairs: 3 General stair comments: safe even without rail  Wheelchair Mobility    Modified Rankin (Stroke Patients Only)       Balance Overall balance assessment: No apparent balance deficits (not formally assessed);Independent                               Standardized Balance Assessment Standardized Balance Assessment : Dynamic Gait Index   Dynamic Gait Index Level Surface: Normal Change in Gait Speed: Normal Gait with Horizontal Head Turns: Mild Impairment Gait with Vertical Head Turns: Normal Gait and Pivot Turn: Normal Step  Over Obstacle: Normal Step Around Obstacles: Normal Steps: Normal Total Score: 23       Pertinent Vitals/Pain Pain Assessment: No/denies pain    Home Living Family/patient expects to be discharged to:: Private residence Living Arrangements: Parent;Other relatives Available Help at Discharge: Family Type of Home: House Home Access: Stairs to enter Entrance Stairs-Rails: None Entrance Stairs-Number of Steps: 3 Home Layout: One level Home Equipment: None 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    Prior Function Level of Independence: Independent               Higher education careers adviser   Dominant Hand: Right    Extremity/Trunk Assessment   Upper Extremity Assessment: Overall WFL for tasks assessed           Lower Extremity Assessment: Overall WFL for tasks assessed      Cervical / Trunk Assessment: Normal  Communication   Communication: No difficulties  Cognition Arousal/Alertness: Awake/alert Behavior During Therapy: WFL for tasks assessed/performed Overall Cognitive Status: Within Functional Limits for tasks assessed                      General Comments      Exercises        Assessment/Plan    PT Assessment Patent does not need any further PT services  PT Diagnosis     PT Problem List    PT Treatment Interventions     PT Goals (Current goals can be found in the Care Plan section) Acute Rehab PT Goals Patient Stated Goal:  wants to go home PT Goal Formulation: All assessment and education complete, DC therapy    Frequency     Barriers to discharge        Co-evaluation               End of Session   Activity Tolerance: Patient tolerated treatment well Patient left: in chair;with family/visitor present;with call bell/phone within reach Nurse Communication: Mobility status    Functional Assessment Tool Used: clinical judgement Functional Limitation: Mobility: Walking and moving around Mobility: Walking and  Moving Around Current Status ([id number](G8978): At least 1 percent but less than 20 percent impaired, limited or restricted Mobility: Walking and Moving Around Goal Status [phone](G8979): At least 1 percent but less than 20 percent impaired, limited or restricted Mobility: Walking and Moving Around Discharge Status [phone](G8980): At least 1 percent but less than 20 percent impaired, limited or restricted    Time: 1520-1540 PT Time Calculation (min) (ACUTE ONLY): 20 min   Charges:   PT Evaluation $Initial PT Evaluation Tier I: 1 Procedure     PT G Codes:   PT G-Codes **NOT FOR INPATIENT CLASS** Functional Assessment Tool Used: clinical judgement Functional Limitation: Mobility: Walking and moving around Mobility: Walking and Moving Around Current Status ([id number](G8978): At least 1 percent but less than 20 percent impaired, limited or restricted Mobility: Walking and Moving Around Goal Status [phone]([id number]): At least 1 percent but less than 20 percent impaired, limited or restricted Mobility: Walking and Moving Around Discharge Status [phone](G8980): At least 1 percent but less than 20 percent impaired, limited or restricted    Kaoru Benda, Eliseo GumKenneth V 04/26/2014, 3:57 PM  04/26/2014  [place] BingKen Keyah Blizard, PT [phone]  (pager)

## 2014-04-26 NOTE — H&P (Signed)
Family [facility] Admission History and Physical Service Pager: [phone]  Patient name: Sarah Pruitt Medical record number: [id number] Date of birth: 07/12/1955 Age: 59 y.o. Gender: female  Primary Care Provider: Sara Neal, MD Consultants: Neuro Code Status: Full  Chief Complaint: Generalized weakness and AMS in setting of syncopal episode  Assessment and Plan: [name] is a 59 y.o. female presenting with syncopal episode with collapse and AMS. PMH is significant for HCV, HTN,   Syncope with collapse: Unsure of eitology of syncopal episodes. No loss of consciousness during event. Code stroke was called in ED. CT and MRI negative. C-spine also negative. Work up unremarkable at this time. Patient recently restarted on BP medications by PCP at office visit 04/24/2014. BB started and now patient with bradycardia. EKG unremarkable. Now with generalized weakness and neuro deficits. Stroke r/o. Broad differential at this time including infectious(no signs of infection on vitals or labs) vs autoimmune (Hx of psoriasis) vs toxin (UDS wnl) vs psychological. Needs further work-up - Admit to telemetry under Dr. Fletke - Neuro checks q2 hrs - Labs ordered per neuro recommendations: TSH, B-12, Mg, Phos, CK, ANA, ESR, Lyme, Serum-NMO, HIV, RPR - would consider LP if not improving - EEG ordered - repeat EKG in AM - PT/OT eval -neurology consulted; appreciate recs  AMS: Patient only mumbling responses. AMS unclear. Could be a conversion disorder in setting of hx of depression and recent stressors at work. No loss of consciousness or head trama with syncopal episode.  -NPO until swallow study -strict bed rest -monitor for improvement -may need psych eval if not improved or cause undetermined elsewhere  HTN: Per family, BP at home 198/104. Fluctuating pressures in the ED. Recently restarted on her pain medications.  -hold BP medications at this time, consider restarting in  AM -would suggest discontinuing BB due to bradycardia  HCV: Hx of hepatitis C. Last quantitative HCV non-detectable. Liver enzymes wnl. Last ultrasound in 2009 unremarkable for liver pathology  FEN/GI: NPO pending swallow study/ 1/2 NS @100mL Prophylaxis: Subq Hep  Disposition: Admit to telemetry for observation.   History of Present Illness: Sarah Pruitt is a 59 y.o. female presenting with syncopal episode with collapse and AMS. PMH is significant for HCV and HTN.  Patient's mother states that after dinner patient had syncopal episode. She states that patient had fell to the ground and they had to pick her up. They noticed that she couldn't open one side of her eyes. She was also weak. Mother believes that she was having a stroke and called EMS. Patient was responsive after time of syncopal episode. However 20 minutes later she couldn't respond verbally. Patient's mother states that she obtained vital signs and patient's blood pressure was elevated 198/104 and HR  90s. Patient Had Taken Her Blood Pressure Medication and Cholesterol Medication around Dinnertime. Before syncopal episode patient was complaining of chest pain and shortness of breath or states that patient has had chest pain for months. Of note patient also had another syncopal episode yesterday at work. Unsure of the circumstances of that event. Patient does have history of depression. She is not on any medications. Patient denies any SI.  Patient is minimally responsive and only mumbling and nodding to respond to questions. Her mother who was in the room provided most of the history.   Review Of Systems: Per HPI. Otherwise 12 point review of systems was performed and was unremarkable.  Patient Active Problem List   Diagnosis Date Noted  . [facility]   arthritis, thumb, degenerative 04/24/2014  . Swelling of joint, wrist, right 08/15/2012  . Swelling, mass, or lump on face 06/28/2012  . Eczema 07/07/2010  . Allergic rhinitis  06/03/2010  . Chronic lower back pain 06/03/2010  . GERD (gastroesophageal reflux disease) 06/03/2010  . Hepatitis C 06/03/2010  . Single cyst of left breast 06/03/2010  . Heart palpitations 06/02/2010  . Neuropathy, peripheral 06/02/2010  . Hypertension 06/02/2010  . Hyperlipidemia 06/02/2010   Past Medical History: Past Medical History  Diagnosis Date  . Allergy   . Hepatitis C   . Peripheral neuropathy     distal foot mostly (moderate), some hand (mild)  . Malaria 1977  . Anemia    Past Surgical History: Past Surgical History  Procedure Laterality Date  . Liver biopsy  2005   Social History: History  Substance Use Topics  . Smoking status: Current Every Day Smoker  . Smokeless tobacco: Not on file  . Alcohol Use: No   Additional social history: Lives at home with family Please also refer to relevant sections of EMR.  Family History: Family History  Problem Relation Age of Onset  . Hypertension Mother   . Schizophrenia Sister    Allergies and Medications: No Known Allergies No current facility-administered medications on file prior to encounter.   Current Outpatient Prescriptions on File Prior to Encounter  Medication Sig Dispense Refill  . amitriptyline (ELAVIL) 75 MG tablet TAKE ONE TABLET BY MOUTH AT BEDTIME 90 tablet 3  . amoxicillin-clavulanate (AUGMENTIN) 875-125 MG per tablet Take 1 tablet by mouth 2 (two) times daily. 20 tablet 0  . Clobetasol Propionate (CLOBETASOL 17 PROPIONATE) 0.5 % POWD Apply a thick coat to your hands every night at bedtime 25 g 12  . Clobetasol Propionate (CLOBETASOL 17 PROPIONATE) 0.5 % POWD Apply a small amount to the area on your neck three times a day and the area on your face once a day 25 g 5  . clotrimazole (LOTRIMIN) 1 % cream Apply topically 2 (two) times daily.      . famotidine (PEPCID) 20 MG tablet Take 1 tablet (20 mg total) by mouth 2 (two) times daily. 180 tablet 3  . hydrocortisone 2.5 % cream Apply topically 2  (two) times daily.      . lisinopril (PRINIVIL,ZESTRIL) 20 MG tablet Take 1 tablet (20 mg total) by mouth 2 (two) times daily. 180 tablet 3  . loratadine (CLARITIN) 10 MG tablet Take 10 mg by mouth daily.      . meloxicam (MOBIC) 7.5 MG tablet Take 2 tablets (15 mg total) by mouth daily. 60 tablet 12  . metoprolol (LOPRESSOR) 50 MG tablet TAKE ONE TABLET BY MOUTH TWICE DAILY 180 tablet 3  . omeprazole (PRILOSEC) 40 MG capsule Take 1 capsule (40 mg total) by mouth daily. 30 capsule 12  . penicillin v potassium (VEETID) 250 MG tablet Take 1 tablet (250 mg total) by mouth 3 (three) times daily. 90 tablet 1  . Salicylic Acid 26 % LIQD Apply 2 drops topically 2 times daily at 12 noon and 4 pm. 10 mL 5  . simvastatin (ZOCOR) 40 MG tablet Take 1 tablet (40 mg total) by mouth at bedtime. 90 tablet 3    Objective: BP 160/71 mmHg  Pulse 53  Temp(Src)   Resp 19  SpO2 100% Exam: General: alert, thin asian woman lying in bed, NAD, minimally responsive Head: normocephalic and atraumatic.  Eyes: PERRLA, no injection and anicteric. Extraocular motion testing unable to be performed.   R. sided ptosis. Upward gaze on retraction of eyelids Neck: C-spine collar in place. Lungs: CTAB, normal respiratory effort, no wheezes.  Heart: bradycardic, regular rhythm, normal heart sounds Abdomen: Bowel sounds normal; abdomen soft and nontender. No masses, organomegaly or hernias noted.  Extremities: No cyanosis, clubbing, edema. DP/PT are full and equal bilaterally.  Neurologic: Alert. Bilateral grip test L 3/5 and R 2/5. Able to wiggle bilateral toes. No spontaneous movements. Flaccid in all extremities. Unknown sensory deficits. Limited exam. Skin: Intact. Plaque psoriasis noted over bilateral knees. Warm and dry.  Labs and Imaging: Results for orders placed or performed during the hospital encounter of 04/25/14 (from the past 24 hour(s))  Ethanol     Status: None   Collection Time: 04/25/14 11:06 PM  Result  Value Ref Range   Alcohol, Ethyl (B) <5 0 - 9 mg/dL  Protime-INR     Status: None   Collection Time: 04/25/14 11:06 PM  Result Value Ref Range   Prothrombin Time 13.5 11.6 - 15.2 seconds   INR 1.02 0.00 - 1.49  APTT     Status: None   Collection Time: 04/25/14 11:06 PM  Result Value Ref Range   aPTT 29 24 - 37 seconds  CBC     Status: None   Collection Time: 04/25/14 11:06 PM  Result Value Ref Range   WBC 6.3 4.0 - 10.5 K/uL   RBC 4.22 3.87 - 5.11 MIL/uL   Hemoglobin 13.0 12.0 - 15.0 g/dL   HCT 38.4 36.0 - 46.0 %   MCV 91.0 78.0 - 100.0 fL   MCH 30.8 26.0 - 34.0 pg   MCHC 33.9 30.0 - 36.0 g/dL   RDW 13.5 11.5 - 15.5 %   Platelets 230 150 - 400 K/uL  Differential     Status: Abnormal   Collection Time: 04/25/14 11:06 PM  Result Value Ref Range   Neutrophils Relative % 45 43 - 77 %   Neutro Abs 2.8 1.7 - 7.7 K/uL   Lymphocytes Relative 41 12 - 46 %   Lymphs Abs 2.6 0.7 - 4.0 K/uL   Monocytes Relative 8 3 - 12 %   Monocytes Absolute 0.5 0.1 - 1.0 K/uL   Eosinophils Relative 6 (H) 0 - 5 %   Eosinophils Absolute 0.4 0.0 - 0.7 K/uL   Basophils Relative 0 0 - 1 %   Basophils Absolute 0.0 0.0 - 0.1 K/uL  Comprehensive metabolic panel     Status: Abnormal   Collection Time: 04/25/14 11:06 PM  Result Value Ref Range   Sodium 140 135 - 145 mmol/L   Potassium 3.7 3.5 - 5.1 mmol/L   Chloride 105 96 - 112 mmol/L   CO2 26 19 - 32 mmol/L   Glucose, Bld 175 (H) 70 - 99 mg/dL   BUN 14 6 - 23 mg/dL   Creatinine, Ser 0.93 0.50 - 1.10 mg/dL   Calcium 9.4 8.4 - 10.5 mg/dL   Total Protein 7.4 6.0 - 8.3 g/dL   Albumin 4.3 3.5 - 5.2 g/dL   AST 26 0 - 37 U/L   ALT 13 0 - 35 U/L   Alkaline Phosphatase 75 39 - 117 U/L   Total Bilirubin 1.1 0.3 - 1.2 mg/dL   GFR calc non Af Amer 67 (L) >90 mL/min   GFR calc Af Amer 78 (L) >90 mL/min   Anion gap 9 5 - 15  I-Stat Troponin, ED (not at [facility])     Status: None   Collection Time:   04/25/14 11:10 PM  Result Value Ref Range   Troponin i, poc 0.00  0.00 - 0.08 ng/mL   Comment 3          I-Stat Chem 8, ED     Status: Abnormal   Collection Time: 04/25/14 11:12 PM  Result Value Ref Range   Sodium 142 135 - 145 mmol/L   Potassium 3.6 3.5 - 5.1 mmol/L   Chloride 102 96 - 112 mmol/L   BUN 15 6 - 23 mg/dL   Creatinine, Ser 0.90 0.50 - 1.10 mg/dL   Glucose, Bld 178 (H) 70 - 99 mg/dL   Calcium, Ion 1.05 (L) 1.12 - 1.23 mmol/L   TCO2 24 0 - 100 mmol/L   Hemoglobin 14.3 12.0 - 15.0 g/dL   HCT 42.0 36.0 - 46.0 %  Urine Drug Screen     Status: None   Collection Time: 04/26/14  1:18 AM  Result Value Ref Range   Opiates NONE DETECTED NONE DETECTED   Cocaine NONE DETECTED NONE DETECTED   Benzodiazepines NONE DETECTED NONE DETECTED   Amphetamines NONE DETECTED NONE DETECTED   Tetrahydrocannabinol NONE DETECTED NONE DETECTED   Barbiturates NONE DETECTED NONE DETECTED  Urinalysis, Routine w reflex microscopic     Status: None   Collection Time: 04/26/14  1:18 AM  Result Value Ref Range   Color, Urine YELLOW YELLOW   APPearance CLEAR CLEAR   Specific Gravity, Urine 1.008 1.005 - 1.030   pH 7.0 5.0 - 8.0   Glucose, UA NEGATIVE NEGATIVE mg/dL   Hgb urine dipstick NEGATIVE NEGATIVE   Bilirubin Urine NEGATIVE NEGATIVE   Ketones, ur NEGATIVE NEGATIVE mg/dL   Protein, ur NEGATIVE NEGATIVE mg/dL   Urobilinogen, UA 0.2 0.0 - 1.0 mg/dL   Nitrite NEGATIVE NEGATIVE   Leukocytes, UA NEGATIVE NEGATIVE    Ct Head Wo Contrast  04/25/2014   CLINICAL DATA:  Code stroke. Felt weak, and fell to floor. Diffuse numbness, mumbling and left-sided gaze. Right-sided facial droop. Initial encounter.  EXAM: CT HEAD WITHOUT CONTRAST  TECHNIQUE: Contiguous axial images were obtained from the base of the skull through the vertex without intravenous contrast.  COMPARISON:  None.  FINDINGS: There is no evidence of acute infarction, mass lesion, or intra- or extra-axial hemorrhage on CT.  The posterior fossa, including the cerebellum, brainstem and fourth ventricle, is  within normal limits. The third and lateral ventricles, and basal ganglia are unremarkable in appearance. The cerebral hemispheres are symmetric in appearance, with normal gray-white differentiation. No mass effect or midline shift is seen.  There is no evidence of fracture; visualized osseous structures are unremarkable in appearance. The orbits are within normal limits. The paranasal sinuses and mastoid air cells are well-aerated. No significant soft tissue abnormalities are seen.  IMPRESSION: Unremarkable noncontrast CT of the head.  These results were called by telephone at the time of interpretation on 04/25/2014 at 11:22 pm to Dr. Jim Like, who verbally acknowledged these results.   Electronically Signed   By: Garald Balding M.D.   On: 04/25/2014 23:22   Mr Brain Wo Contrast  04/26/2014   CLINICAL DATA:  Recent fall, now with numbness, right eye droop, and left TS, slurred speech.  EXAM: MRI HEAD WITHOUT CONTRAST  MRI CERVICAL SPINE WITHOUT CONTRAST  TECHNIQUE: Multiplanar, multiecho pulse sequences of the brain and surrounding structures, and cervical spine, to include the craniocervical junction and cervicothoracic junction, were obtained without intravenous contrast.  COMPARISON:  Prior CT from earlier the same day.  FINDINGS: MRI HEAD FINDINGS  Cerebral volume within normal limits for patient age. No significant white matter changes present. No mass lesion or midline shift. No hydrocephalus. No extra-axial fluid collection.  No abnormal foci of restricted diffusion to suggest acute intracranial infarct. Gray-white matter differentiation maintained. Normal intravascular flow voids present. No acute or chronic intracranial hemorrhage.  Mild cerebellar tonsillar ectopia of approximately 3 mm present at the craniocervical junction without frank Chiari malformation.  Pituitary gland normal.  No acute abnormality seen about the orbits.  Paranasal sinuses are clear.  No mastoid effusion.  Bone marrow signal  intensity normal. Scalp soft tissues within normal limits.  MRI CERVICAL SPINE FINDINGS  Vertebral bodies are normally aligned with preservation of the normal cervical lordosis. Vertebral body heights are preserved. No fracture or listhesis. Signal intensity within the vertebral body bone marrow is normal. No focal osseous lesion.  Signal intensity within the cervical spinal cord is normal.  Paraspinous soft tissues within normal limits. Normal intravascular flow voids present within the vertebral arteries bilaterally.  At C4-5, there is a tiny central disc protrusion without significant stenosis.  At C6-7, there is diffuse disc bulge with mild bilateral uncovertebral spurring. Disc bulging is slightly centric to the right. There is resultant mild to moderate bilateral foraminal stenosis, slightly worse on the left. Posterior disc bulge mildly indents and flattens the ventral thecal sac and results in mild canal stenosis.  No other significant degenerative changes identified within the cervical spine. Visualized upper thoracic spine within normal limits.  IMPRESSION: MRI HEAD IMPRESSION:  1. No acute intracranial infarct or other abnormality identified. 2. Mild cerebellar tonsillar ectopia of approximately 3 mm without frank Chiari malformation. 3. Otherwise unremarkable MRI of the brain.  MRI CERVICAL SPINE IMPRESSION:  1. No acute traumatic injury within the cervical spine 2. Degenerative disc bulge with bilateral uncovertebral spurring at C6-7 with resultant mild canal and mild to moderate bilateral foraminal stenosis, slightly worse on the left. 3. Tiny central disc protrusion at C4-5 without significant stenosis. 4. Otherwise unremarkable MRI of the cervical spine.   Electronically Signed   By: Benjamin  McClintock M.D.   On: 04/26/2014 01:40   Mr Cervical Spine Wo Contrast  04/26/2014   CLINICAL DATA:  Recent fall, now with numbness, right eye droop, and left TS, slurred speech.  EXAM: MRI HEAD WITHOUT  CONTRAST  MRI CERVICAL SPINE WITHOUT CONTRAST  TECHNIQUE: Multiplanar, multiecho pulse sequences of the brain and surrounding structures, and cervical spine, to include the craniocervical junction and cervicothoracic junction, were obtained without intravenous contrast.  COMPARISON:  Prior CT from earlier the same day.  FINDINGS: MRI HEAD FINDINGS  Cerebral volume within normal limits for patient age. No significant white matter changes present. No mass lesion or midline shift. No hydrocephalus. No extra-axial fluid collection.  No abnormal foci of restricted diffusion to suggest acute intracranial infarct. Gray-white matter differentiation maintained. Normal intravascular flow voids present. No acute or chronic intracranial hemorrhage.  Mild cerebellar tonsillar ectopia of approximately 3 mm present at the craniocervical junction without frank Chiari malformation.  Pituitary gland normal.  No acute abnormality seen about the orbits.  Paranasal sinuses are clear.  No mastoid effusion.  Bone marrow signal intensity normal. Scalp soft tissues within normal limits.  MRI CERVICAL SPINE FINDINGS  Vertebral bodies are normally aligned with preservation of the normal cervical lordosis. Vertebral body heights are preserved. No fracture or listhesis. Signal intensity within the vertebral body bone marrow is normal. No focal osseous lesion.    Signal intensity within the cervical spinal cord is normal.  Paraspinous soft tissues within normal limits. Normal intravascular flow voids present within the vertebral arteries bilaterally.  At C4-5, there is a tiny central disc protrusion without significant stenosis.  At C6-7, there is diffuse disc bulge with mild bilateral uncovertebral spurring. Disc bulging is slightly centric to the right. There is resultant mild to moderate bilateral foraminal stenosis, slightly worse on the left. Posterior disc bulge mildly indents and flattens the ventral thecal sac and results in mild canal  stenosis.  No other significant degenerative changes identified within the cervical spine. Visualized upper thoracic spine within normal limits.  IMPRESSION: MRI HEAD IMPRESSION:  1. No acute intracranial infarct or other abnormality identified. 2. Mild cerebellar tonsillar ectopia of approximately 3 mm without frank Chiari malformation. 3. Otherwise unremarkable MRI of the brain.  MRI CERVICAL SPINE IMPRESSION:  1. No acute traumatic injury within the cervical spine 2. Degenerative disc bulge with bilateral uncovertebral spurring at C6-7 with resultant mild canal and mild to moderate bilateral foraminal stenosis, slightly worse on the left. 3. Tiny central disc protrusion at C4-5 without significant stenosis. 4. Otherwise unremarkable MRI of the cervical spine.   Electronically Signed   By: Benjamin  McClintock M.D.   On: 04/26/2014 01:40   Dg Chest Portable 1 View  04/26/2014   CLINICAL DATA:  Code stroke  EXAM: PORTABLE CHEST - 1 VIEW  COMPARISON:  None.  FINDINGS: Normal heart size and mediastinal contours. No acute infiltrate or edema. No effusion or pneumothorax. No acute osseous findings.  IMPRESSION: Negative chest.   Electronically Signed   By: Jonathon  Watts M.D.   On: 04/26/2014 02:44    Jazma Y Phelps, DO 04/26/2014, 2:41 AM PGY-1, [facility] FPTS Intern pager: [phone], text pages welcome  I have seen and examined the patient. I have read and agree with the above note. My changes are noted in blue.  Ralph Nettey, MD PGY-2, [facility] 04/26/2014, 8:18 AM  

## 2014-04-26 NOTE — ED Notes (Signed)
Paged Dr. Hosie PoissonSumner to [place], RN

## 2014-04-26 NOTE — ED Notes (Signed)
Only pts left eye is open

## 2014-04-26 NOTE — Progress Notes (Signed)
INITIAL NUTRITION ASSESSMENT Pt meets criteria for MODERATE MALNUTRITION in the context of Chronic illness as evidenced by eating <75% of estimated energy needs for > 1 month and moderate muscle wasting. DOCUMENTATION CODES Per approved criteria  -Non-severe (moderate) malnutrition in the context of chronic illness   INTERVENTION: -Ensure Complete po BID, each supplement provides 350 kcal and 13 grams of protein  NUTRITION DIAGNOSIS: Inadequate oral intake related to poor appetite as evidenced by eating only 1 small meal a day and reported loss of ~5% bw since the start of this year  Goal: Pt to meet >/= 90% of their estimated nutrition needs   Monitor:  Oral intake, weight, Labs,   Reason for Assessment: MST of 24  59 y.o. female  Admitting Dx: <principal problem not specified>  ASSESSMENT: Tinika Marcantonio is a 59 y.o. female presenting with syncopal episode with collapse and AMS. PMH is significant for HCV, HTN,   Pt reports that she eats, but just keeps losing weight and is unsure why. On further investigation, it was found she only eats 1 meal a day. Shell occasionally have a snack with that. She reports that she just is not hungry. She does not follow any diet or take any supps/vitamins at home.  She reports she has been losing weight for years. She was 125 a couple years ago and has fallen to 106 at the start of this year. She feels weak and knows she has lost some muscle.  Etiology of wt loss likely from the combination of her having increased pro/cal needs d/t Hep C and in general a very poor appetite.   Nutrition Focused Physical Exam: Limited d/t patient positioning  Subcutaneous Fat:  Orbital Region: none Upper Arm Region: n/a Thoracic and Lumbar Region: n/a  Muscle:  Temple Region: Mild Clavicle Bone Region:moderate Clavicle and Acromion Bone Region: moderate Scapular Bone Region: n/a Dorsal Hand: n/a Patellar Region: none Anterior Thigh Region:  mild Posterior Calf Region: none  Edema: none  Height: Ht Readings from Last 1 Encounters:  04/26/14 5\' 1"  (1.549 m)    Weight: Wt Readings from Last 1 Encounters:  04/26/14 101 lb (45.813 kg)    Ideal Body Weight: 105  % Ideal Body Weight: 96%  Wt Readings from Last 10 Encounters:  04/26/14 101 lb (45.813 kg)  04/23/14 103 lb 1.6 oz (46.766 kg)  09/26/12 122 lb (55.339 kg)  08/27/12 121 lb 9.6 oz (55.157 kg)  08/15/12 118 lb (53.524 kg)  07/03/12 123 lb (55.792 kg)  06/28/12 124 lb (56.246 kg)  05/23/12 128 lb 12.8 oz (58.423 kg)  03/08/12 130 lb 9.6 oz (59.24 kg)  07/28/11 122 lb (55.339 kg)    Usual Body Weight: 106 (start of this year)  % Usual Body Weight: 95%  BMI:  Body mass index is 19.09 kg/(m^2).  Estimated Nutritional Needs: Kcal: 1600-1850 (35-40 kcal/kg) Protein: >69 (1.5 g/kg) Fluid: 1.6-1.9 liters  Skin: Dry/eczema, wound on knee  Diet Order: Diet NPO time specified  EDUCATION NEEDS: -No education needs identified at this time   Intake/Output Summary (Last 24 hours) at 04/26/14 0857 Last data filed at 04/26/14 0118  Gross per 24 hour  Intake      0 ml  Output    430 ml  Net   -430 ml    Last BM: N/A  Labs:   Recent Labs Lab 04/23/14 1030 04/25/14 2306 04/25/14 2312 04/26/14 0635  NA 141 140 142  --   K 4.2 3.7 3.6  --  CL 106 105 102  --   CO2 27 26  --   --   BUN --   CREATININE 0.81 0.93 0.90 0.84  CALCIUM 9.3 9.4  --   --   MG  --   --   --  2.3  PHOS  --   --   --  4.4  GLUCOSE 99 175* 178*  --     CBG (last 3)  No results for input(s): GLUCAP in the last 72 hours.  Scheduled Meds: . antiseptic oral rinse  7 mL Mouth Rinse q12n4p  . chlorhexidine  15 mL Mouth Rinse BID  . heparin  5,000 Units Subcutaneous 3 times per day  . [START ON 04/27/2014] Influenza vac split quadrivalent PF  0.5 mL Intramuscular Tomorrow-1000  . LORazepam      . [START ON 04/27/2014] pneumococcal 23 valent vaccine  0.5 mL  Intramuscular Tomorrow-1000    Continuous Infusions: . sodium chloride 100 mL/hr at 04/26/14 0620    Past Medical History  Diagnosis Date  . Allergy   . Hepatitis C   . Peripheral neuropathy     distal foot mostly (moderate), some hand (mild)  . Malaria 1977  . Anemia     Past Surgical History  Procedure Laterality Date  . Liver biopsy  2005    Christophe Louis RD, LDN Nutrition Pager: [phone] 04/26/2014 8:57 AM

## 2014-04-27 ENCOUNTER — Encounter (HOSPITAL_COMMUNITY): Payer: Self-pay

## 2014-04-27 DIAGNOSIS — R55 Syncope and collapse: Secondary | ICD-10-CM

## 2014-04-27 LAB — RPR: RPR: NONREACTIVE

## 2014-04-27 NOTE — Progress Notes (Signed)
  Echocardiogram 2D Echocardiogram has been performed.  Aris EvertsRix, Keita Demarco A 04/27/2014, 9:50 AM

## 2014-04-27 NOTE — Progress Notes (Signed)
Pt being discharged with mother and sister. Pt has spoken with Child psychotherapistsocial worker, and education, as well as a plan have been discussed. Pt verbalizes she is ready to go home. Per MD patient is to follow up with Dr Jennette KettleNeal on the 9th. An outpt EEG has also been scheduled. All discharge paperwork has been gone over with pt and family, questions have been answered. Cecille Rubinhompson,Tanija Germani V, RN

## 2014-04-27 NOTE — Discharge Instructions (Signed)
Discharge Date: 04/27/2014  Reason for Hospitalization: Altered mental status and syncope  We did a complete work-up. It does nto appear that you had a stroke or seizure that caused you to collapse. All your labs were unremarkable. We believe that it is important for you to follow-up with your PCP so an appointment has been scheduled. I would encourage you to talk to your doctor about starting a medication for depression.  New medications: None Discontinue taking metoprolol at this time.  Thank you for letting us participate in your care!
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
## 2014-04-28 ENCOUNTER — Other Ambulatory Visit (HOSPITAL_COMMUNITY): Payer: 59

## 2014-04-28 LAB — B. BURGDORFI ANTIBODIES: B burgdorferi Ab IgG+IgM: <0.91 {ISR} (ref 0.00–0.90)

## 2014-04-29 ENCOUNTER — Encounter: Payer: Self-pay | Admitting: Family Medicine

## 2014-04-30 ENCOUNTER — Ambulatory Visit (INDEPENDENT_AMBULATORY_CARE_PROVIDER_SITE_OTHER): Payer: 59 | Admitting: Family Medicine

## 2014-04-30 ENCOUNTER — Encounter: Payer: Self-pay | Admitting: Family Medicine

## 2014-04-30 VITALS — BP 154/71 | HR 65 | Temp 98.1°F | Ht 61.0 in | Wt 99.0 lb

## 2014-04-30 DIAGNOSIS — R55 Syncope and collapse: Secondary | ICD-10-CM

## 2014-04-30 MED ORDER — CITALOPRAM HYDROBROMIDE 40 MG PO TABS: 40.0000 mg | ORAL_TABLET | Freq: Every day | ORAL | Status: DC

## 2014-04-30 NOTE — Patient Instructions (Signed)
I am starting you on citalopram One a day See me in 2 weeks as scheduled Call me with problems I think you have an appropriate plan--I am proud of you!

## 2014-05-01 NOTE — Progress Notes (Signed)
   Subjective:    Patient ID: Sarah Pruitt, female    DOB: 02/14/1956, 59 y.o.   MRN: [id number]  HPI Follow-up recent hospitalization for mental status changes and weakness. Today she is me the full back story about the home stressors.   Review of Systems No fever, sweats, chills. Her appetite has been decrease in her feet take his been decreased. Her social stressors have been significantly increased in she's had some intermittent chest tightness, difficulty sleeping, anhedonia.    Objective:   Physical Exam  Vital signs are reviewed GEN.: Well-developed thin female no acute distress PSYCHIATRIC: Affect is flat but interactive in a subdued manner. She speaks in a low voice. Her speech is normal in content. She has intact recent and remote memory. She asks and answers questions appropriately.  NEURO: There are no focal neurological deficits noted grossly.      Assessment & Plan:

## 2014-05-01 NOTE — Assessment & Plan Note (Signed)
We spent 45 minutes together today and she gave me the full back story on the home issues. There is significant amount stress there. Also reviewed the social worker's note. I do think this was the etiology of her decreased level of consciousness on her admission at the hospital. We spent greater than 50% of our office time in counseling education regarding these issues. She agreed to start antidepressant therapy and I'll see her back in 3-4 weeks.

## 2014-05-05 LAB — NEUROMYELITIS OPTICA AUTOAB, IGG: NMO-IgG: NEGATIVE

## 2014-05-14 ENCOUNTER — Ambulatory Visit (INDEPENDENT_AMBULATORY_CARE_PROVIDER_SITE_OTHER): Payer: 59 | Admitting: Family Medicine

## 2014-05-14 ENCOUNTER — Encounter: Payer: Self-pay | Admitting: Family Medicine

## 2014-05-14 VITALS — BP 154/82 | HR 62 | Temp 98.4°F | Ht 61.0 in | Wt 96.8 lb

## 2014-05-14 DIAGNOSIS — I10 Essential (primary) hypertension: Secondary | ICD-10-CM

## 2014-05-14 DIAGNOSIS — L409 Psoriasis, unspecified: Secondary | ICD-10-CM

## 2014-05-14 DIAGNOSIS — E440 Moderate protein-calorie malnutrition: Secondary | ICD-10-CM

## 2014-05-15 DIAGNOSIS — L409 Psoriasis, unspecified: Secondary | ICD-10-CM | POA: Insufficient documentation

## 2014-05-15 MED ORDER — CLOBETASOL 17 PROPIONATE 0.5 % POWD: Status: DC

## 2014-05-15 MED ORDER — METOPROLOL TARTRATE 50 MG PO TABS: 50.0000 mg | ORAL_TABLET | Freq: Two times a day (BID) | ORAL | Status: DC

## 2014-05-15 NOTE — Assessment & Plan Note (Signed)
She has had good response from topical medium to high dose steroids saw have her use that and see her back in one month.

## 2014-05-15 NOTE — Progress Notes (Signed)
   Subjective:    Patient ID: Sarah Pruitt, female    DOB: 07/23/1955, 59 y.o.   MRN: [id number]  HPI #1. Depression/anxiety. I started her on antidepressant last office visit and she's tolerating well. Says she does feel little bit more common centered. #2. Blood pressure elevated today. We had stopped her metoprolol due to some low pressures. She's not had any chest pains, no shortness of breath, no change in excised tolerance. No palpitations, no lower extremity edema #3. Breaking out a rash on bilateral knees. Very similar to the rash she's had on her hands. It is quite itchy. #4. Social stressors at home. The seem to stabilize somewhat.   Review of Systems Denies suicidal or homicidal ideation. No confusion. She reports her appetite is good although she has continued to lose weight     Objective:   Physical Exam  Vital signs reviewed. GENERAL: Well-developed, well-nourished, no acute distress. CARDIOVASCULAR: Regular rate and rhythm no murmur gallop or rub LUNGS: Clear to auscultation bilaterally, no rales or wheeze. ABDOMEN: Soft positive bowel sounds NEURO: No gross focal neurological deficits. MSK: Movement of extremity x 4. PSYCH:: Alert oriented 4. She is interactive although she always has a quiet voice. Her speech is normal in content and fluency. She asks and answers questions appropriately no psychomotor retardation, no sign of agitation. Denies hallucination. SKIN: Bilateral knees have a thick plaque on the anterior portion. There is some mild excoriation. Some silver slight scale consistent with psoriasis. Her hands have only some mild flaking skin.       Assessment & Plan:

## 2014-05-15 NOTE — Assessment & Plan Note (Signed)
I think her weight loss is related to her stressors at home. We discussed at length spending  greater pretty percent of our 30 minute office visit in counseling and education regarding these issues. I really want her to gain some weight but, see her back in one month.

## 2014-05-15 NOTE — Assessment & Plan Note (Signed)
I'll start her back on her metoprolol and see her back in one month.

## 2014-06-02 IMAGING — CR DG WRIST COMPLETE 3+V*R*
4 series · 4 of 4 positions shown · non-contrast
Comparison: None.

CLINICAL DATA: Right wrist pain after fall.

RIGHT WRIST - COMPLETE 3+ VIEW

[x wrist pa right]
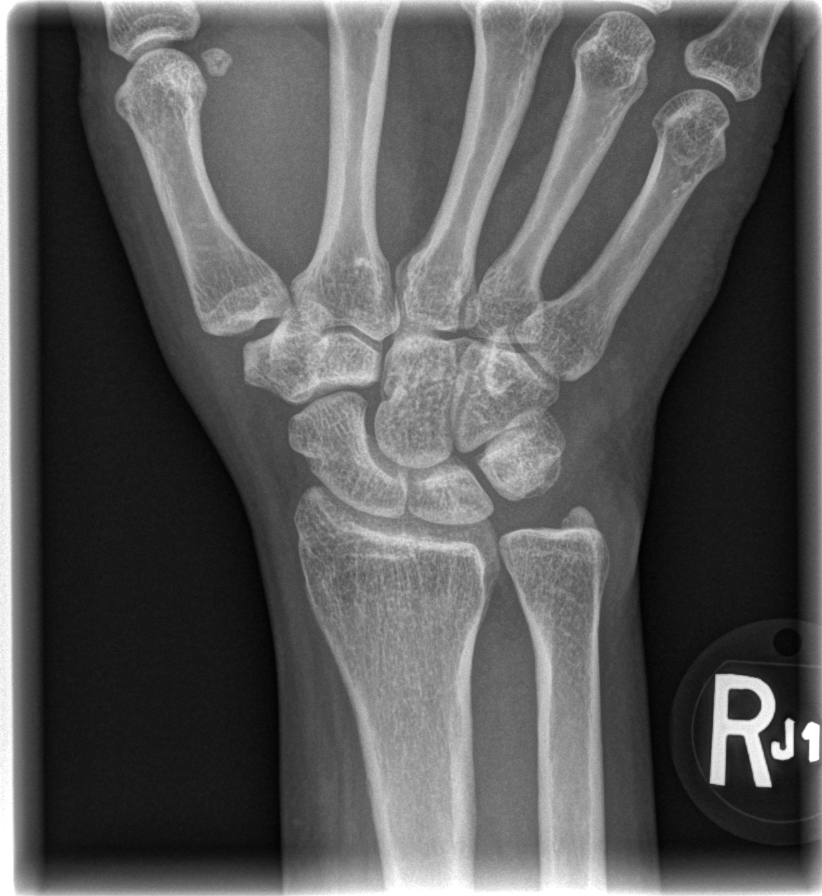

[x wrist obl right]
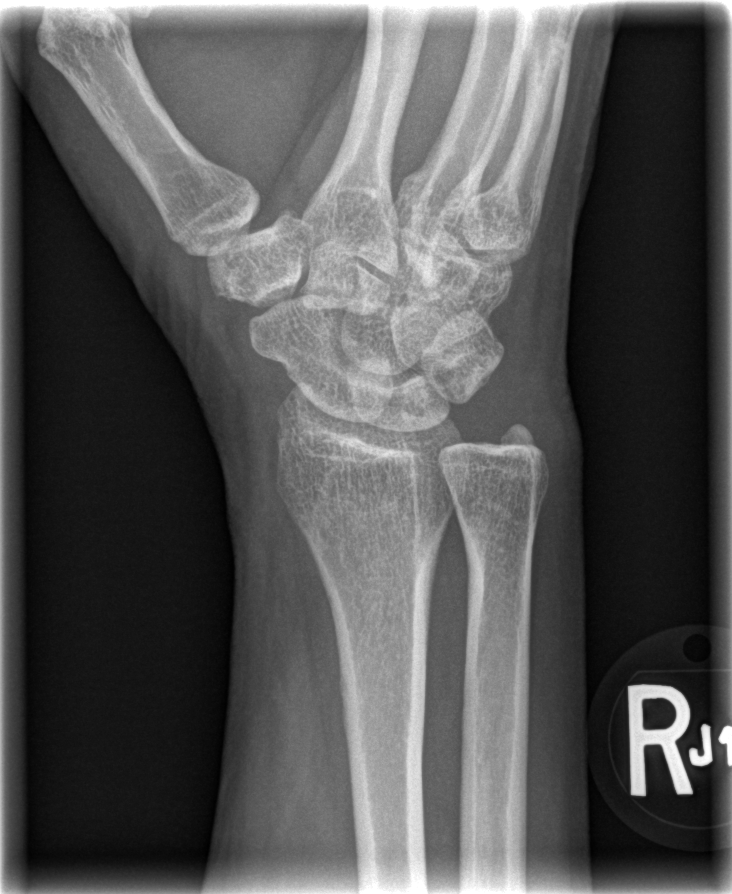

[x wrist lat right]
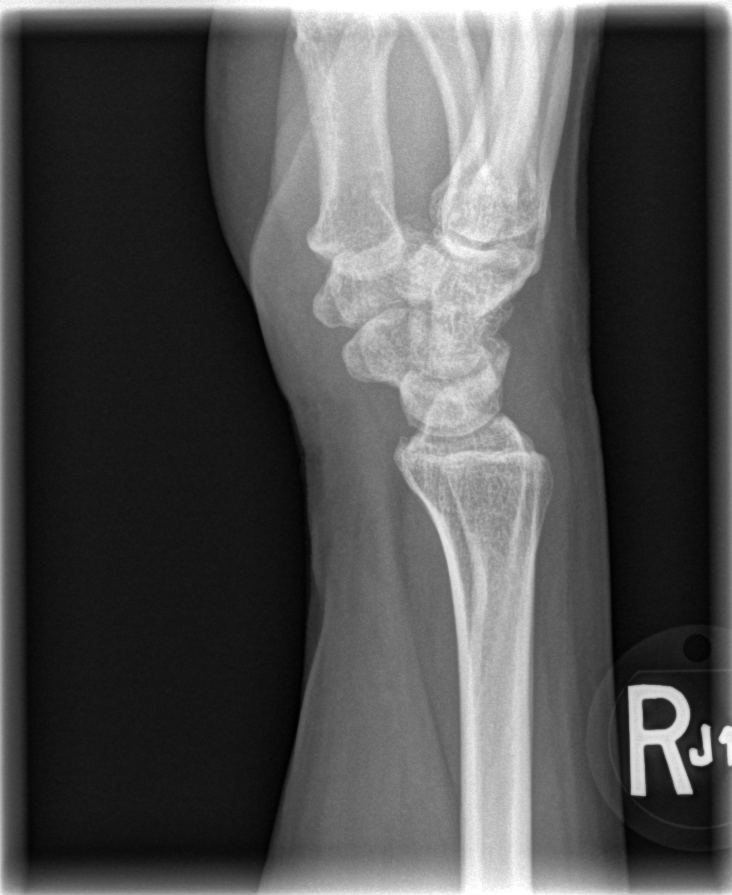

[x navicular]
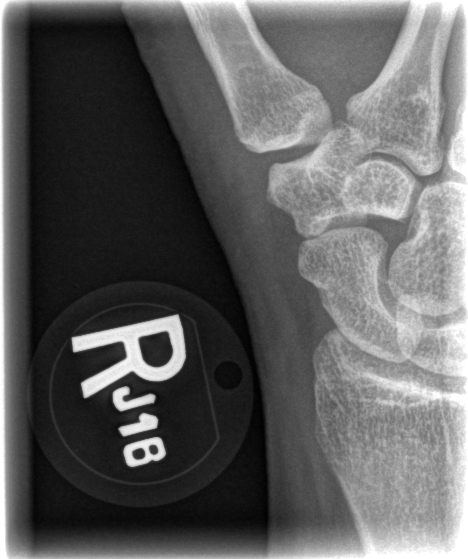

[4 of 4 positions shown; findings below may reference images not displayed]

FINDINGS: No fracture or dislocation is noted.  Joint spaces are
intact. No soft tissue abnormality is noted.
IMPRESSION: Normal right wrist.

## 2014-06-11 ENCOUNTER — Encounter: Payer: Self-pay | Admitting: Family Medicine

## 2014-06-11 ENCOUNTER — Ambulatory Visit (INDEPENDENT_AMBULATORY_CARE_PROVIDER_SITE_OTHER): Payer: 59 | Admitting: Family Medicine

## 2014-06-11 VITALS — BP 135/70 | HR 55 | Temp 98.6°F | Ht 61.0 in | Wt 97.0 lb

## 2014-06-11 DIAGNOSIS — E440 Moderate protein-calorie malnutrition: Secondary | ICD-10-CM

## 2014-06-11 DIAGNOSIS — I10 Essential (primary) hypertension: Secondary | ICD-10-CM

## 2014-06-11 DIAGNOSIS — F418 Other specified anxiety disorders: Secondary | ICD-10-CM | POA: Diagnosis not present

## 2014-06-11 DIAGNOSIS — R002 Palpitations: Secondary | ICD-10-CM | POA: Diagnosis not present

## 2014-06-11 NOTE — Assessment & Plan Note (Signed)
Also improved with reinstitution of beta blocker

## 2014-06-11 NOTE — Progress Notes (Signed)
   Subjective:    Patient ID: Sarah Pruitt, female    DOB: 09/12/1955, 59 y.o.   MRN: [id number]  HPI Follow-up starting antidepressant medication, follow-up restarting her blood pressure medicine, follow-up unintentional weight loss #1. Anxiety/depression. Feels like the medicine is really helping. She is sleeping better at night. Still awakens once or twice but previously she had been wakening about every hour. During the day she feels like she can focus much better and feels happier. No problems with the medication; things at home are about the same #2. Follow-up hypertension, heart palpitations. Noticed her pulse was low were and that is a good thing because she was having some racing heart rates previously. She did note that the other night while she was lying in bed her pulse was about 45. She felt fine. When her pulse is in the 45-50 range she does not notice any dizziness. #3. Unintentional weight loss. She's gained 1 pound since I saw her back. She says her appetite is a little bit better. She feels like she is going to do well with eating over the next month or so as she feels overall much better.   Review of Systems No nausea, no emesis, no fever, sweats, chills. Normal bowel movements.    Objective:   Physical Exam  Vital signs reviewed. GENERAL: Well-developed, well-nourished, no acute distress. CARDIOVASCULAR: Regular rate and rhythm no murmur gallop or rub LUNGS: Clear to auscultation bilaterally, no rales or wheeze. ABDOMEN: Soft positive bowel sounds NEURO: No gross focal neurological deficits. MSK: Movement of extremity x 4. PSYCH: Alert and oriented 4. Affect is interactive. She smiles frequently today.        Assessment & Plan:

## 2014-06-11 NOTE — Assessment & Plan Note (Signed)
Significantly improved on antidepressant therapy. We discussed today. I certainly hope she'll continue take this regularly. I will see her back in 6 weeks.

## 2014-06-11 NOTE — Assessment & Plan Note (Signed)
She has gained 1 pound. I do think were improving things generally with her depression which hopefully will allow her to gain another 5-10 pounds back. I set a goal for her to gain 4-5 pounds in the next 6 weeks.

## 2014-06-11 NOTE — Assessment & Plan Note (Signed)
Improved after we added her beta blocker back. We'll continue that.

## 2014-07-23 ENCOUNTER — Ambulatory Visit (INDEPENDENT_AMBULATORY_CARE_PROVIDER_SITE_OTHER): Payer: 59 | Admitting: Family Medicine

## 2014-07-23 ENCOUNTER — Encounter: Payer: Self-pay | Admitting: Family Medicine

## 2014-07-23 VITALS — BP 121/65 | HR 63 | Temp 98.2°F | Ht 61.0 in | Wt 98.8 lb

## 2014-07-23 DIAGNOSIS — R002 Palpitations: Secondary | ICD-10-CM | POA: Diagnosis not present

## 2014-07-23 DIAGNOSIS — I10 Essential (primary) hypertension: Secondary | ICD-10-CM | POA: Diagnosis not present

## 2014-07-23 DIAGNOSIS — F418 Other specified anxiety disorders: Secondary | ICD-10-CM | POA: Diagnosis not present

## 2014-07-23 MED ORDER — CITALOPRAM HYDROBROMIDE 40 MG PO TABS: 40.0000 mg | ORAL_TABLET | Freq: Every day | ORAL | Status: DC

## 2014-07-24 NOTE — Progress Notes (Signed)
   Subjective:    Patient ID: Sarah Pruitt, female    DOB: 03/03/1955, 59 y.o.   MRN: [id number]  HPI #1. Follow-up heart palpitations. Since we started her back on the beta blocker she's had no more episodes of this. She feels much more settled as those palpitations worried her. She's had no chest pain, no shortness of breath, no change in exercise tolerance. #2. She continues to tolerate the citalopram without any problem. She sees that is making a big difference in her ability to manage everyday stress. Stress at home continues to be severe. She sleeping a little bit better, now she can sleep 2-3 hours at a stretch during the night. Then she'll wake up and have to watch TV for while 4 she can go back to sleep. She continues to work full-time.   Review of Systems See hpi    Objective:   Physical Exam   Vital signs reviewed. GENERAL: Well-developed, well-nourished, no acute distress. CARDIOVASCULAR: Regular rate and rhythm no murmur gallop or rub LUNGS: Clear to auscultation bilaterally, no rales or wheeze. ABDOMEN: Soft positive bowel sounds NEURO: No gross focal neurological deficits. MSK: Movement of extremity x 4.       Assessment & Plan:

## 2014-07-24 NOTE — Assessment & Plan Note (Signed)
Improved back on her beta blocker Will continue

## 2014-07-24 NOTE — Assessment & Plan Note (Signed)
Some improvement I think close f/u is going rt be key She explained her long term plans to move her Mom to [place] once her sister moves there, ultimately potentially getting her brother to "take care" of sister's house which would make him feel useful and they would no longer have to cohabitate. It may take a year or more for these plans to coalesce. In the meantime she agrees to continue medication and close f/u here. .Marland Kitchen

## 2014-07-24 NOTE — Assessment & Plan Note (Signed)
Much better control contninue current regimen

## 2014-08-27 ENCOUNTER — Encounter: Payer: Self-pay | Admitting: Family Medicine

## 2014-08-27 ENCOUNTER — Ambulatory Visit (INDEPENDENT_AMBULATORY_CARE_PROVIDER_SITE_OTHER): Payer: 59 | Admitting: Family Medicine

## 2014-08-27 VITALS — BP 151/72 | HR 50 | Temp 98.2°F | Ht 61.0 in | Wt 104.4 lb

## 2014-08-27 DIAGNOSIS — F418 Other specified anxiety disorders: Secondary | ICD-10-CM | POA: Diagnosis not present

## 2014-08-27 DIAGNOSIS — E440 Moderate protein-calorie malnutrition: Secondary | ICD-10-CM | POA: Diagnosis not present

## 2014-08-27 DIAGNOSIS — I10 Essential (primary) hypertension: Secondary | ICD-10-CM

## 2014-08-27 DIAGNOSIS — R002 Palpitations: Secondary | ICD-10-CM

## 2014-08-28 NOTE — Assessment & Plan Note (Signed)
Currently we had good control

## 2014-08-28 NOTE — Assessment & Plan Note (Signed)
Significantly associated with home stressors. We discussed these in detail today spinning greater than 50% of our 25 minute office visit counseling and education regarding these.

## 2014-08-28 NOTE — Progress Notes (Signed)
   Subjective:    Patient ID: Sarah Pruitt, female    DOB: 04/06/1955, 59 y.o.   MRN: [id number]  HPI #1. Follow-up hypertension. Taking her medicines regularly. She's feeling much better with less headaches, no episodes of feeling dizzy, much less heart palpitations. #2. Weight loss. Stressors continue at home but she is gradually making headway. Her appetite is somewhat better and she's trying to be more focused about her diet, getting good nutrition in more appropriately than before although yet not ideal #3. Home stressors. Gradually working through these   Review of Systems Positive for some intentional weight gain. See history of present illness above for additional pertinent review of systems.    Objective:   Physical Exam  Vital signs reviewed. GENERAL: Well-developed, well-nourished, no acute distress. CARDIOVASCULAR: Regular rate and rhythm no murmur gallop or rub LUNGS: Clear to auscultation bilaterally, no rales or wheeze. ABDOMEN: Soft positive bowel sounds NEURO: No gross focal neurological deficits. MSK: Movement of extremity x 4. PSYCH: Alert and oriented 4. Affect is interactive.        Assessment & Plan:

## 2014-08-28 NOTE — Assessment & Plan Note (Signed)
They gradually improvement in her weight status. We discussed again. I challenged her to gain 5 more pounds when I see her back in 2 months.

## 2014-08-28 NOTE — Assessment & Plan Note (Signed)
Much improved on current medicine regimen.

## 2015-07-04 ENCOUNTER — Other Ambulatory Visit: Payer: Self-pay | Admitting: Family Medicine

## 2015-07-29 ENCOUNTER — Other Ambulatory Visit: Payer: Self-pay | Admitting: Family Medicine

## 2015-07-29 DIAGNOSIS — I10 Essential (primary) hypertension: Secondary | ICD-10-CM

## 2015-07-29 MED ORDER — LISINOPRIL 20 MG PO TABS: 20.0000 mg | ORAL_TABLET | Freq: Two times a day (BID) | ORAL | Status: DC

## 2015-07-29 NOTE — Telephone Encounter (Signed)
Refill request for Lisinopril

## 2015-08-02 ENCOUNTER — Other Ambulatory Visit: Payer: Self-pay | Admitting: Family Medicine

## 2015-09-06 ENCOUNTER — Other Ambulatory Visit: Payer: Self-pay | Admitting: Family Medicine

## 2016-02-09 IMAGING — CT CT HEAD W/O CM
2 series · 15 of 30 positions shown, 19 images · non-contrast
Comparison: None.

CLINICAL DATA: Code stroke. Felt weak, and fell to floor. Diffuse
numbness, mumbling and left-sided gaze. Right-sided facial droop.
Initial encounter.

EXAM:
CT HEAD WITHOUT CONTRAST
TECHNIQUE: Contiguous axial images were obtained from the base of the skull
through the vertex without intravenous contrast.

[Series 201: head w/o, idose (1) · axial · non-contrast · 0.49mm/px · z∈[+954,+1074]mm · 13 of 29 slices shown, 17 images]
[im 3/29  brain]
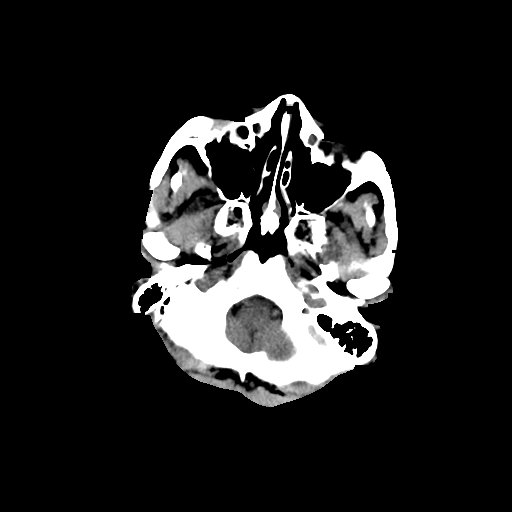
[im 3/29  bone]
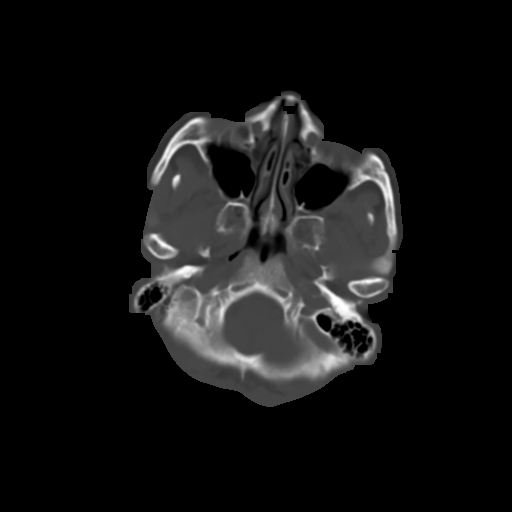
[im 5/29  brain]
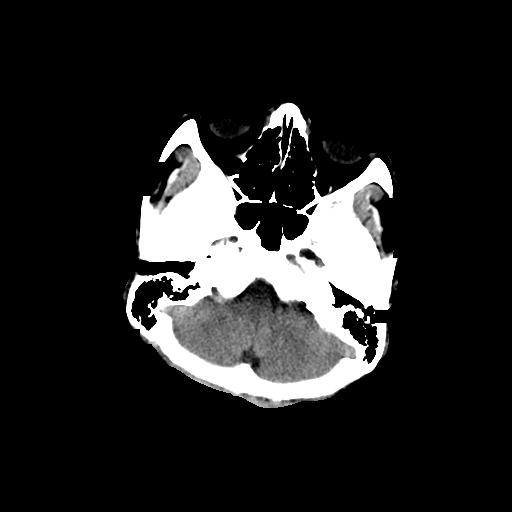
[im 7/29  brain]
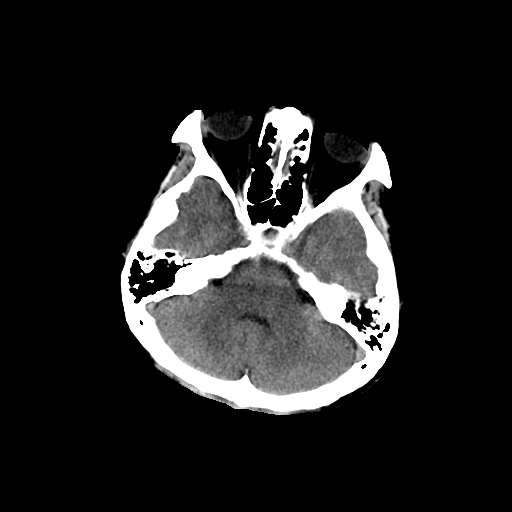
[im 9/29  brain]
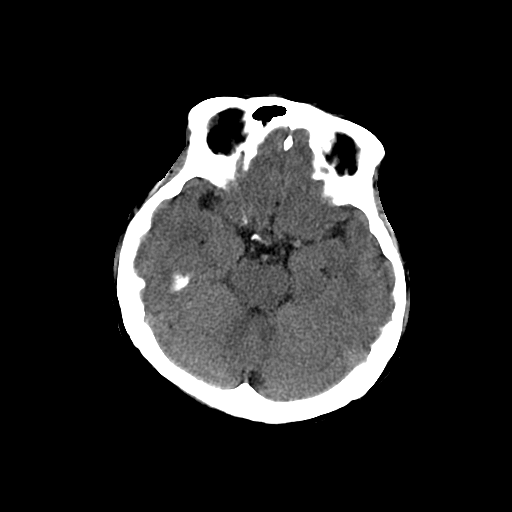
[im 11/29  brain]
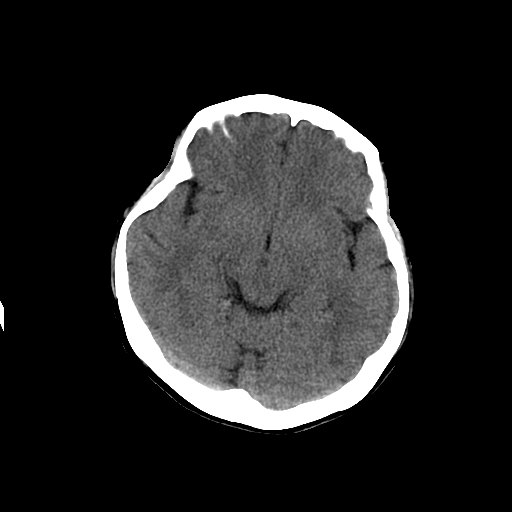
[im 11/29  bone]
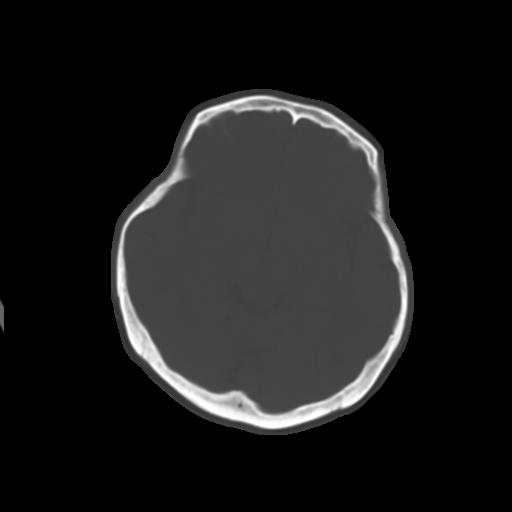
[im 13/29  brain]
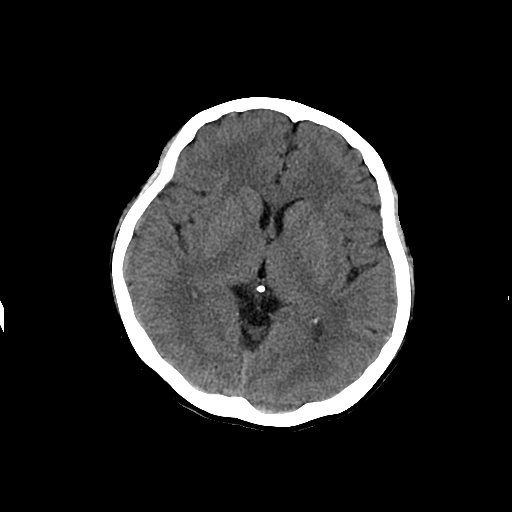
[im 15/29  brain]
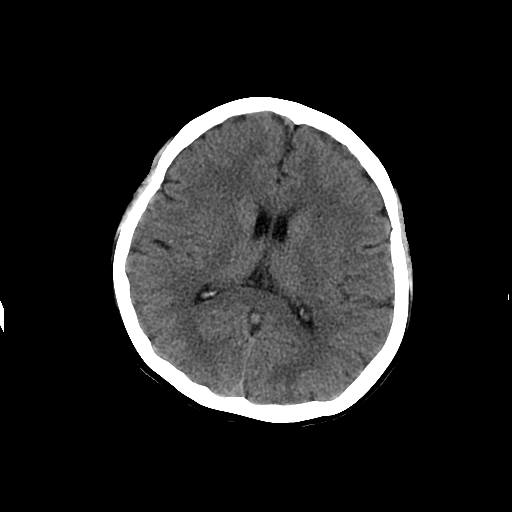
[im 17/29  brain]
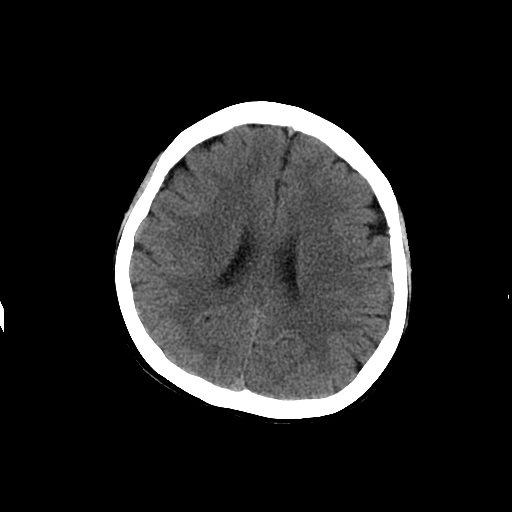
[im 19/29  brain]
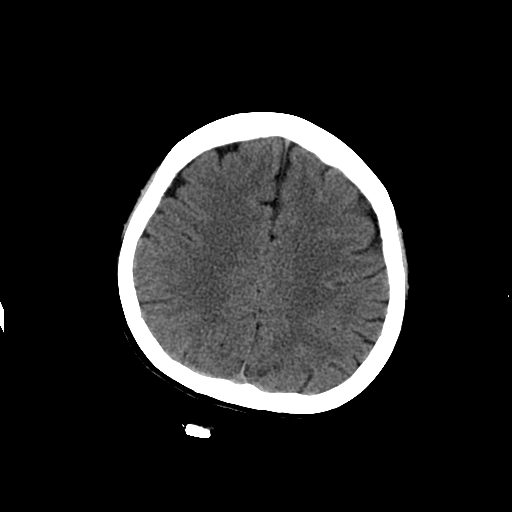
[im 19/29  bone]
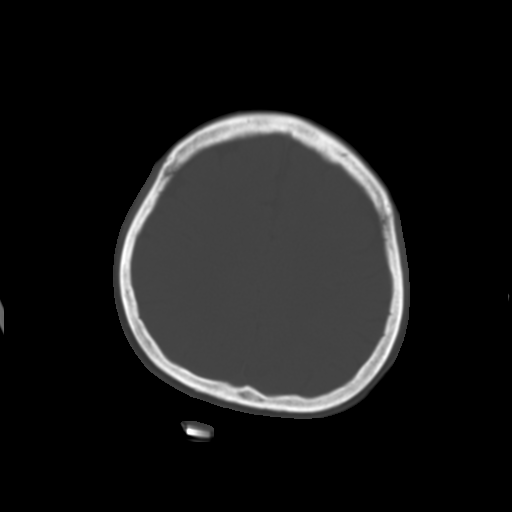
[im 21/29  brain]
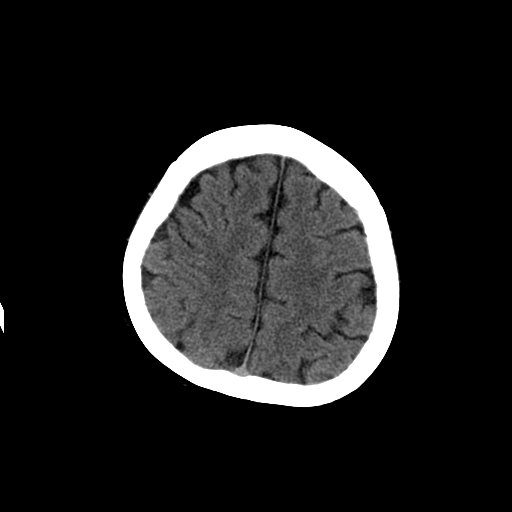
[im 23/29  brain]
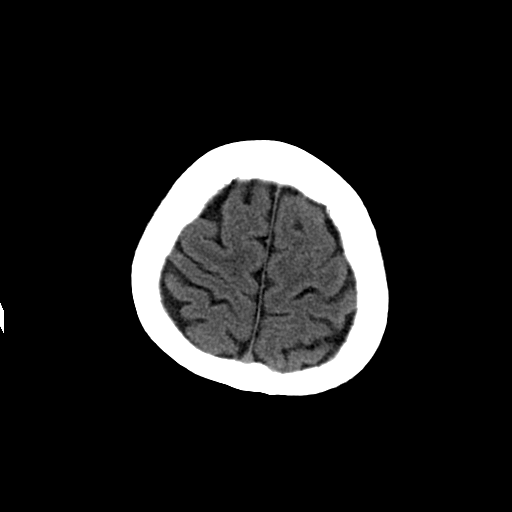
[im 25/29  brain]
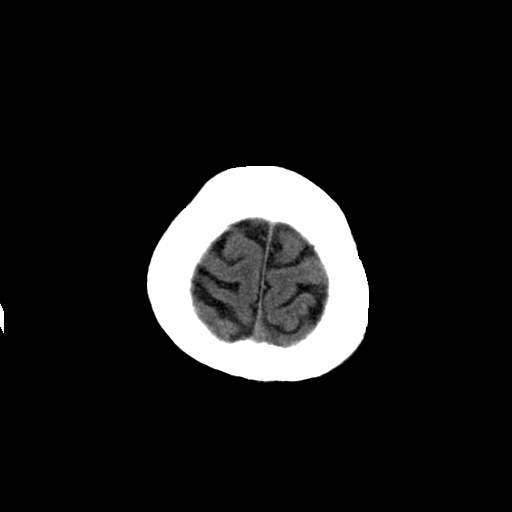
[im 27/29  brain]
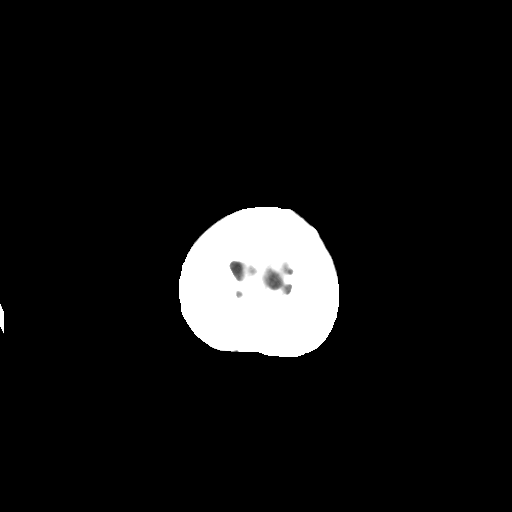
[im 27/29  bone]
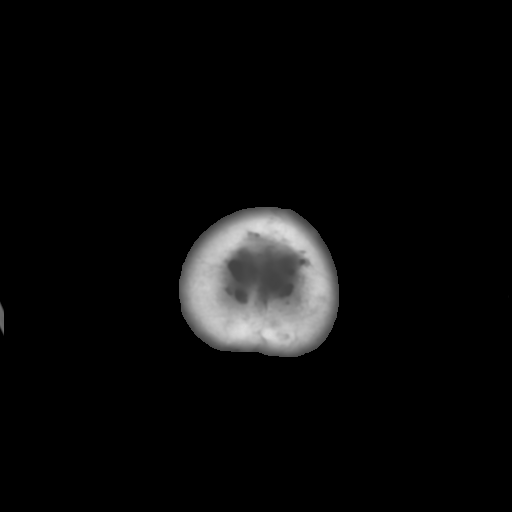

[Series 202: head w/o bone, idose (1) · axial · non-contrast · 0.49mm/px · z∈[+954,+974]mm · 2 of 29 slices shown]
[im 3/29  bone]
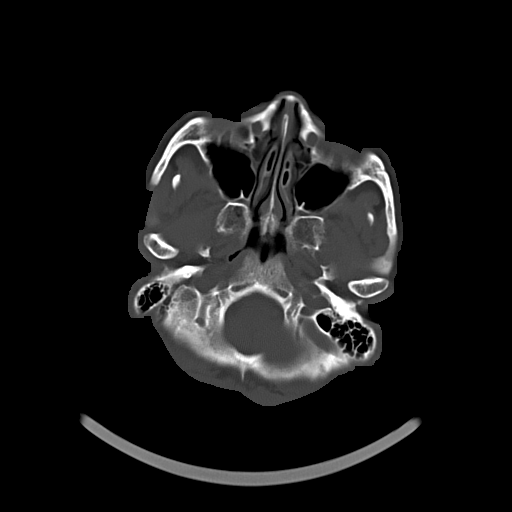
[im 7/29  bone]
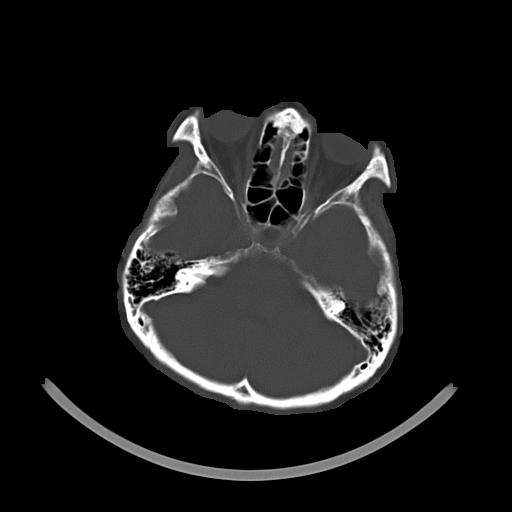

[15 of 30 positions shown; findings below may reference images not displayed]

FINDINGS: There is no evidence of acute infarction, mass lesion, or intra- or
extra-axial hemorrhage on CT.

The posterior fossa, including the cerebellum, brainstem and fourth
ventricle, is within normal limits. The third and lateral
ventricles, and basal ganglia are unremarkable in appearance. The
cerebral hemispheres are symmetric in appearance, with normal
gray-white differentiation. No mass effect or midline shift is seen.

There is no evidence of fracture; visualized osseous structures are
unremarkable in appearance. The orbits are within normal limits. The
paranasal sinuses and mastoid air cells are well-aerated. No
significant soft tissue abnormalities are seen.
IMPRESSION: Unremarkable noncontrast CT of the head.

These results were called by telephone at the time of interpretation
on 04/25/2014 at [DATE] to Dr. Poema Noyola, who verbally
acknowledged these results.

## 2016-03-14 ENCOUNTER — Ambulatory Visit (INDEPENDENT_AMBULATORY_CARE_PROVIDER_SITE_OTHER): Payer: BLUE CROSS/BLUE SHIELD | Admitting: Internal Medicine

## 2016-03-14 ENCOUNTER — Encounter: Payer: Self-pay | Admitting: Internal Medicine

## 2016-03-14 VITALS — BP 154/98 | HR 56 | Temp 97.9°F | Ht 61.0 in | Wt 117.0 lb

## 2016-03-14 DIAGNOSIS — Z23 Encounter for immunization: Secondary | ICD-10-CM | POA: Diagnosis not present

## 2016-03-14 DIAGNOSIS — R42 Dizziness and giddiness: Secondary | ICD-10-CM | POA: Diagnosis not present

## 2016-03-14 DIAGNOSIS — I10 Essential (primary) hypertension: Secondary | ICD-10-CM

## 2016-03-14 LAB — BASIC METABOLIC PANEL WITH GFR
BUN: 15 mg/dL (ref 7–25)
CHLORIDE: 104 mmol/L (ref 98–110)
CO2: 27 mmol/L (ref 20–31)
Calcium: 10.1 mg/dL (ref 8.6–10.4)
Creat: 0.75 mg/dL (ref 0.50–1.05)
GFR, Est African American: >89 mL/min (ref >=60)
GFR, Est Non African American: 88 mL/min (ref >=60)
Glucose, Bld: 112 mg/dL — ABNORMAL HIGH (ref 65–99)
Potassium: 4.4 mmol/L (ref 3.5–5.3)
SODIUM: 139 mmol/L (ref 135–146)

## 2016-03-14 MED ORDER — LISINOPRIL 40 MG PO TABS: 40.0000 mg | ORAL_TABLET | Freq: Every day | ORAL | 0 refills | Status: DC

## 2016-03-14 MED ORDER — HYDROCHLOROTHIAZIDE 12.5 MG PO CAPS: 12.5000 mg | ORAL_CAPSULE | Freq: Every day | ORAL | 0 refills | Status: DC

## 2016-03-14 NOTE — Progress Notes (Signed)
   [facility] Phone: [phone]  Subjective:  Sarah Pruitt is a 61 year old female presenting to clinic for a same day visit for hypertension. Three days ago, she noticed that she "just didn't feel right". Her vision was "smoky" and "light didn't look as bright as normal". She went to bed. The next morning, she checked her blood pressure and it was 195/97. She took her normal dose of Metoprolol and Lisinopril and rechecked her blood pressure, which was 206/111. She then took one of her mom's blood pressure medications (she does not know the name of this medication). She rechecked her blood pressure and it was 197/97. Later that night, her blood pressure was 170/90. This morning before her appointment, her blood pressure was 160/95. Her changes in vision and "feeling unbalanced" have completely resolved. She denies any chest pain or shortness of breath. She denies any lower extremity edema. She denies any weakness or numbness in her extremities. She denies any fevers, chills, cough, runny nose.  ROS: See HPI for pertinent positives and negatives  Past Medical History- HTN, GERD, Hep C, HLD  Family history reviewed for today's visit. No changes.  Social history- patient is a current smoker  Objective: BP (!) 154/98   Pulse (!) 56   Temp 97.9 F (36.6 C) (Oral)   Ht 5\' 1"  (1.549 m)   Wt 117 lb (53.1 kg)   SpO2 99%   BMI 22.11 kg/m  Gen: NAD, alert, cooperative with exam HEENT: NCAT, EOMI, MMM CV: Mildly bradycardic, no murmur Resp: CTABL, no wheezes, normal work of breathing Msk: No lower extremity edema Neuro: Awake, alert, oriented, CN 2-12 intact, 5/5 muscle strength in upper and lower extremities bilaterally, sensation intact to light touch, normal finger-to-nose  Assessment/Plan: Hypertension: Uncontrolled. Has not been seen for this since 2016. BP 154/98 in clinic. Taking Metoprolol 25mg  bid and Lisinopril 20mg  bid.  - Change Lisinopril to 40mg  daily -  Continue Metoprolol 25mg  bid - Add HCTZ 12.5mg  daily  - Check BMP  - Follow-up with PCP in 1 week for blood pressure follow-up. If still elevated, can consider increasing HCTZ dose.  Vision Changes/Dizziness: Think this is likely related to her elevated BPs to 206/111 at home. Her symptoms completely resolved when her blood pressure returned to normal. Her symptoms lasted ~48 hours. Neuro exam is completely normal today, so stroke is unlikely. - Discussed stroke symptoms and reasons to return to clinic - Follow-up if symptoms return  Health Care Maintenance: - Pt received flu shot today   Willadean CarolKaty Chrisopher Pustejovsky, MD PGY-2

## 2016-03-14 NOTE — Patient Instructions (Addendum)
It was so nice to meet you!  I have changed your Lisinopril to 40mg  daily. Please continue to take the Metoprolol as directed. I have added a new medication called Hydrochlorothiazide. Please take 1 tablet daily in the morning.  Please schedule an appointment to be seen with Dr. Jennette KettleNeal in 1 week for recheck of your blood pressure.  -Dr. Nancy MarusMayo

## 2016-03-14 NOTE — Assessment & Plan Note (Signed)
Think this is likely related to her elevated BPs to 206/111 at home. Her symptoms completely resolved when her blood pressure returned to normal. Her symptoms lasted ~48 hours. Neuro exam is completely normal today, so stroke is unlikely. - Discussed stroke symptoms and reasons to return to clinic - Follow-up if symptoms return

## 2016-03-14 NOTE — Assessment & Plan Note (Signed)
Uncontrolled. Has not been seen for this since 2016. BP 154/98 in clinic. Taking Metoprolol 25mg  bid and Lisinopril 20mg  bid.  - Change Lisinopril to 40mg  daily - Continue Metoprolol 25mg  bid - Add HCTZ 12.5mg  daily  - Check BMP  - Follow-up with PCP in 1 week for blood pressure follow-up. If still elevated, can consider increasing HCTZ dose.

## 2016-03-15 ENCOUNTER — Telehealth: Payer: Self-pay | Admitting: *Deleted

## 2016-03-15 NOTE — Telephone Encounter (Signed)
-----   Message from Campbell StallKaty Dodd Mayo, MD sent at 03/15/2016  7:52 AM EST ----- Please let Ms. Stults know that her labs were normal. We will see her back in 1 week for follow-up of her blood pressure. Thanks!

## 2016-03-15 NOTE — Telephone Encounter (Signed)
Patient made aware of results and confirmed her appointment for next week with Dr. Jennette KettleNeal. Nolon StallsJazmin [facility],CMA

## 2016-03-23 ENCOUNTER — Encounter: Payer: Self-pay | Admitting: Family Medicine

## 2016-03-23 ENCOUNTER — Ambulatory Visit (INDEPENDENT_AMBULATORY_CARE_PROVIDER_SITE_OTHER): Payer: BLUE CROSS/BLUE SHIELD | Admitting: Family Medicine

## 2016-03-23 DIAGNOSIS — I10 Essential (primary) hypertension: Secondary | ICD-10-CM

## 2016-03-23 MED ORDER — HYDROCHLOROTHIAZIDE 12.5 MG PO CAPS: 12.5000 mg | ORAL_CAPSULE | Freq: Every day | ORAL | 3 refills | Status: DC

## 2016-03-23 MED ORDER — LISINOPRIL 40 MG PO TABS: 40.0000 mg | ORAL_TABLET | Freq: Every day | ORAL | 3 refills | Status: DC

## 2016-03-23 NOTE — Progress Notes (Signed)
    CHIEF COMPLAINT / HPI:   #1. Hypertension: At last office visit they increased her blood pressure medication regimen because she had another episode of extremely elevated pressures with some headache, visual changes. Since the posterior on the additional medication she's not had any problems. She's tolerating the medicines well. She's taking them regularly. She's not had a shortness of breath or chest pain. She's had no lower extremity edema.  REVIEW OF SYSTEMS:  See history of present illness  OBJECTIVE:  Vital signs are reviewed.   Vital signs reviewed. GENERAL: Well-developed, well-nourished, no acute distress. CARDIOVASCULAR: Regular rate and rhythm no murmur gallop or rub LUNGS: Clear to auscultation bilaterally, no rales or wheeze. ABDOMEN: Soft positive bowel sounds NEURO: No gross focal neurological deficits. MSK: Movement of extremity x 4.    ASSESSMENT / PLAN: Please see problem oriented charting for details Additionally, they for information on setting up her mammogram and her colonoscopy.

## 2016-03-23 NOTE — Assessment & Plan Note (Signed)
Unclear why she had this second episode of increased blood pressure but seems to have resolved and she has much better control today on her current medication regimen. I will continue this. We'll check some lab work today and I would like to see her back in 4-6 weeks.

## 2016-07-02 ENCOUNTER — Other Ambulatory Visit: Payer: Self-pay | Admitting: Family Medicine

## 2016-08-13 ENCOUNTER — Other Ambulatory Visit: Payer: Self-pay | Admitting: Family Medicine

## 2017-04-22 ENCOUNTER — Other Ambulatory Visit: Payer: Self-pay | Admitting: Family Medicine

## 2017-07-29 ENCOUNTER — Other Ambulatory Visit: Payer: Self-pay | Admitting: Family Medicine

## 2018-05-18 ENCOUNTER — Other Ambulatory Visit: Payer: Self-pay | Admitting: Family Medicine

## 2018-08-11 ENCOUNTER — Other Ambulatory Visit: Payer: Self-pay | Admitting: Family Medicine

## 2018-08-13 NOTE — Telephone Encounter (Signed)
Dear Dema Severin Team I have refilled However she needs appt in next 2-3 months THANKS! Dorcas Mcmurray

## 2018-08-13 NOTE — Telephone Encounter (Signed)
Contacted pt and informed her of below. She said that her insurance is now the [facility].  She is going to find a doctor to go to in her network at [facility]. Informed her that the medication was at pharmacy but not sure that her insurance would cover it since our doctor prescribed it. Removed Dr. Nori Riis as her PCP.   Routing to PCP as an Pharmacist, hospital. April Zimmerman Rumple, CMA

## 2019-08-31 ENCOUNTER — Other Ambulatory Visit: Payer: Self-pay | Admitting: Family Medicine

## 2019-10-03 ENCOUNTER — Ambulatory Visit (HOSPITAL_COMMUNITY)
Admission: EM | Admit: 2019-10-03 | Discharge: 2019-10-03 | Disposition: A | Discharge status: Home / Self Care | Payer: 59 | Attending: Family Medicine | Admitting: Family Medicine

## 2019-10-03 ENCOUNTER — Encounter (HOSPITAL_COMMUNITY): Payer: Self-pay

## 2019-10-03 ENCOUNTER — Telehealth (HOSPITAL_COMMUNITY): Payer: Self-pay | Admitting: Family Medicine

## 2019-10-03 ENCOUNTER — Other Ambulatory Visit: Payer: Self-pay

## 2019-10-03 DIAGNOSIS — T490X6A Underdosing of local antifungal, anti-infective and anti-inflammatory drugs, initial encounter: Secondary | ICD-10-CM | POA: Insufficient documentation

## 2019-10-03 DIAGNOSIS — L409 Psoriasis, unspecified: Secondary | ICD-10-CM | POA: Insufficient documentation

## 2019-10-03 DIAGNOSIS — T43226A Underdosing of selective serotonin reuptake inhibitors, initial encounter: Secondary | ICD-10-CM | POA: Diagnosis not present

## 2019-10-03 DIAGNOSIS — F411 Generalized anxiety disorder: Secondary | ICD-10-CM | POA: Insufficient documentation

## 2019-10-03 DIAGNOSIS — Z760 Encounter for issue of repeat prescription: Secondary | ICD-10-CM | POA: Insufficient documentation

## 2019-10-03 DIAGNOSIS — I10 Essential (primary) hypertension: Secondary | ICD-10-CM | POA: Diagnosis not present

## 2019-10-03 DIAGNOSIS — E785 Hyperlipidemia, unspecified: Secondary | ICD-10-CM | POA: Insufficient documentation

## 2019-10-03 DIAGNOSIS — Z91128 Patient's intentional underdosing of medication regimen for other reason: Secondary | ICD-10-CM | POA: Diagnosis not present

## 2019-10-03 DIAGNOSIS — T464X6A Underdosing of angiotensin-converting-enzyme inhibitors, initial encounter: Secondary | ICD-10-CM | POA: Insufficient documentation

## 2019-10-03 DIAGNOSIS — K219 Gastro-esophageal reflux disease without esophagitis: Secondary | ICD-10-CM | POA: Insufficient documentation

## 2019-10-03 DIAGNOSIS — Z8619 Personal history of other infectious and parasitic diseases: Secondary | ICD-10-CM | POA: Insufficient documentation

## 2019-10-03 DIAGNOSIS — T502X6A Underdosing of carbonic-anhydrase inhibitors, benzothiadiazides and other diuretics, initial encounter: Secondary | ICD-10-CM | POA: Insufficient documentation

## 2019-10-03 DIAGNOSIS — F17210 Nicotine dependence, cigarettes, uncomplicated: Secondary | ICD-10-CM | POA: Insufficient documentation

## 2019-10-03 DIAGNOSIS — Z20822 Contact with and (suspected) exposure to covid-19: Secondary | ICD-10-CM | POA: Insufficient documentation

## 2019-10-03 DIAGNOSIS — R05 Cough: Secondary | ICD-10-CM | POA: Insufficient documentation

## 2019-10-03 DIAGNOSIS — T447X6A Underdosing of beta-adrenoreceptor antagonists, initial encounter: Secondary | ICD-10-CM | POA: Diagnosis not present

## 2019-10-03 DIAGNOSIS — T466X6A Underdosing of antihyperlipidemic and antiarteriosclerotic drugs, initial encounter: Secondary | ICD-10-CM | POA: Diagnosis not present

## 2019-10-03 DIAGNOSIS — E7849 Other hyperlipidemia: Secondary | ICD-10-CM

## 2019-10-03 LAB — COMPREHENSIVE METABOLIC PANEL
ALT: 27 U/L (ref 0–44)
AST: 39 U/L (ref 15–41)
Albumin: 4.4 g/dL (ref 3.5–5.0)
Alkaline Phosphatase: 62 U/L (ref 38–126)
Anion gap: 8 (ref 5–15)
BUN: 18 mg/dL (ref 8–23)
CO2: 28 mmol/L (ref 22–32)
Calcium: 10 mg/dL (ref 8.9–10.3)
Chloride: 105 mmol/L (ref 98–111)
Creatinine, Ser: 1.37 mg/dL — ABNORMAL HIGH (ref 0.44–1.00)
GFR calc Af Amer: 48 mL/min — ABNORMAL LOW (ref >60)
GFR calc non Af Amer: 42 mL/min — ABNORMAL LOW (ref >60)
Glucose, Bld: 97 mg/dL (ref 70–99)
Potassium: 4.4 mmol/L (ref 3.5–5.1)
Sodium: 141 mmol/L (ref 135–145)
Total Bilirubin: 1.3 mg/dL — ABNORMAL HIGH (ref 0.3–1.2)
Total Protein: 7.5 g/dL (ref 6.5–8.1)

## 2019-10-03 LAB — LIPID PANEL
Cholesterol: 186 mg/dL (ref 0–200)
HDL: 74 mg/dL (ref >40)
LDL Cholesterol: 96 mg/dL (ref 0–99)
Total CHOL/HDL Ratio: 2.5 RATIO
Triglycerides: 81 mg/dL (ref <150)
VLDL: 16 mg/dL (ref 0–40)

## 2019-10-03 LAB — CBC
HCT: 38.8 % (ref 36.0–46.0)
Hemoglobin: 12.7 g/dL (ref 12.0–15.0)
MCH: 31 pg (ref 26.0–34.0)
MCHC: 32.7 g/dL (ref 30.0–36.0)
MCV: 94.6 fL (ref 80.0–100.0)
Platelets: 239 10*3/uL (ref 150–400)
RBC: 4.1 MIL/uL (ref 3.87–5.11)
RDW: 13.1 % (ref 11.5–15.5)
WBC: 4.3 10*3/uL (ref 4.0–10.5)
nRBC: 0 % (ref 0.0–0.2)

## 2019-10-03 LAB — VITAMIN D 25 HYDROXY (VIT D DEFICIENCY, FRACTURES): Vit D, 25-Hydroxy: 19.98 ng/mL — ABNORMAL LOW (ref 30–100)

## 2019-10-03 LAB — TSH: TSH: 1.072 u[IU]/mL (ref 0.350–4.500)

## 2019-10-03 MED ORDER — HYDROCHLOROTHIAZIDE 12.5 MG PO TABS: 12.5000 mg | ORAL_TABLET | Freq: Every day | ORAL | 3 refills | Status: DC

## 2019-10-03 MED ORDER — OMEPRAZOLE 20 MG PO CPDR: 20.0000 mg | DELAYED_RELEASE_CAPSULE | Freq: Every day | ORAL | 3 refills | Status: DC

## 2019-10-03 MED ORDER — CITALOPRAM HYDROBROMIDE 40 MG PO TABS: 40.0000 mg | ORAL_TABLET | Freq: Every day | ORAL | 0 refills | Status: DC

## 2019-10-03 MED ORDER — LISINOPRIL 40 MG PO TABS: 40.0000 mg | ORAL_TABLET | Freq: Every day | ORAL | 3 refills | Status: DC

## 2019-10-03 MED ORDER — CLOBETASOL 17 PROPIONATE 0.5 % POWD: 12 refills | Status: DC

## 2019-10-03 MED ORDER — METOPROLOL TARTRATE 50 MG PO TABS: 50.0000 mg | ORAL_TABLET | Freq: Two times a day (BID) | ORAL | 0 refills | Status: DC

## 2019-10-03 MED ORDER — ATORVASTATIN CALCIUM 40 MG PO TABS: 40.0000 mg | ORAL_TABLET | Freq: Every day | ORAL | 3 refills | Status: DC

## 2019-10-03 NOTE — ED Triage Notes (Signed)
Pt presents for medication refill; pt states she has been without meds since June and her BP has been elevated.

## 2019-10-03 NOTE — Discharge Instructions (Signed)
I have refilled your medications.  We are doing some basic lab work Please follow-up with primary care doctor for further maintenance of your chronic medical conditions.

## 2019-10-03 NOTE — Telephone Encounter (Signed)
Pt called and went over lab results Recommended not taking the Atorvastatin for now due to normal Lipid Panel.  She can discuss this with her primary care doctor at recheck.

## 2019-10-03 NOTE — ED Provider Notes (Signed)
[facility]    CSN: [id number] Arrival date & time: 10/03/19  1355      History   Chief Complaint Chief Complaint  Patient presents with  . Hypertension  . Medication Refill    HPI Sarah Pruitt is a 64 y.o. female.   Patient is a 64 year old female with past medical history of hypertension, allergy, hyperlipidemia, psoriasis, GERD.  She presents today for refill on her prescription medications.  Reporting that she has been out of these medications for the past few months.  Is in between primary care providers at this time.  Has had some elevated blood pressure readings.  Also reporting mild cough, chills, body aches and fever.  Would like to be tested for Covid.  No chest pain or shortness of breath.  No dizziness, weakness, lightheadedness, blurred vision   , Hyperlipidemia, GERD.  She presents today  Past Medical History:  Diagnosis Date  . Allergy   . Anemia   . Hepatitis C   . Hepatitis C    pt states she was diagnosed and treated for Hep C at [facility]  . Malaria 1977  . Peripheral neuropathy    distal foot mostly (moderate), some hand (mild)    Patient Active Problem List   Diagnosis Date Noted  . Dizziness 03/14/2016  . 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 Psoriasis 05/15/2014  . Syncope and collapse 04/26/2014  . Malnutrition of moderate degree (HCC) 04/26/2014  . Altered mental status   . Weakness   . 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 Swelling of joint, wrist, right 08/15/2012  . Swelling, mass, or lump on face 06/28/2012  . Eczema 07/07/2010  . Allergic rhinitis 06/03/2010  . Chronic lower back pain 06/03/2010  . GERD (gastroesophageal reflux disease) 06/03/2010  . Hepatitis C 06/03/2010  . Single cyst of left breast 06/03/2010  . Heart palpitations 06/02/2010  . Neuropathy, peripheral 06/02/2010  . Hypertension 06/02/2010  . Hyperlipidemia 06/02/2010    Past Surgical History:  Procedure Laterality Date  . LIVER BIOPSY   2005    OB History   No obstetric history on file.      Home Medications    Prior to Admission medications   Medication Sig Start Date End Date Taking? Authorizing Provider  atorvastatin (LIPITOR) 40 MG tablet Take 1 tablet (40 mg total) by mouth daily. 10/03/19   Dahlia Byes A, NP  citalopram (CELEXA) 40 MG tablet Take 1 tablet (40 mg total) by mouth daily. 10/03/19   Dahlia Byes A, NP  Clobetasol Propionate (CLOBETASOL 17 PROPIONATE) 0.5 % POWD Apply a thick coat to your hands and knees once or twice daily as directed for psoriasis 10/03/19   Dahlia Byes A, NP  hydrochlorothiazide (HYDRODIURIL) 12.5 MG tablet Take 1 tablet (12.5 mg total) by mouth daily. 10/03/19   Dahlia Byes A, NP  lisinopril (ZESTRIL) 40 MG tablet Take 1 tablet (40 mg total) by mouth daily. 10/03/19   Dahlia Byes A, NP  metoprolol tartrate (LOPRESSOR) 50 MG tablet Take 1 tablet (50 mg total) by mouth 2 (two) times daily. 10/03/19   Janace Aris, NP    Family History Family History  Problem Relation Age of Onset  . Hypertension Mother   . Schizophrenia Sister     Social History Social History   Tobacco Use  . Smoking status: Current Every Day Smoker    Packs/day: 0.25    Types: Cigarettes  . Smokeless tobacco: Never Used  Substance Use Topics  .  Alcohol use: No    Alcohol/week: 0.0 standard drinks  . Drug use: No     Allergies   Patient has no known allergies.   Review of Systems Review of Systems   Physical Exam Triage Vital Signs ED Triage Vitals  Enc Vitals Group     BP 10/03/19 1448 (!) 157/93     Pulse Rate 10/03/19 1448 91     Resp 10/03/19 1448 18     Temp 10/03/19 1448 98.3 F (36.8 C)     Temp Source 10/03/19 1448 Oral     SpO2 10/03/19 1448 99 %     Weight --      Height --      Head Circumference --      Peak Flow --      Pain Score 10/03/19 1446 0     Pain Loc --      Pain Edu? --      Excl. in GC? --    No data found.  Updated Vital Signs BP (!) 157/93 (BP  Location: Right Arm)   Pulse 91   Temp 98.3 F (36.8 C) (Oral)   Resp 18   SpO2 99%   Visual Acuity Right Eye Distance:   Left Eye Distance:   Bilateral Distance:    Right Eye Near:   Left Eye Near:    Bilateral Near:     Physical Exam Vitals and nursing note reviewed.  Constitutional:      General: She is not in acute distress.    Appearance: Normal appearance. She is not ill-appearing, toxic-appearing or diaphoretic.  HENT:     Head: Normocephalic.     Nose: Nose normal.     Mouth/Throat:     Pharynx: Oropharynx is clear.  Eyes:     Conjunctiva/sclera: Conjunctivae normal.  Cardiovascular:     Rate and Rhythm: Normal rate and regular rhythm.  Pulmonary:     Effort: Pulmonary effort is normal.     Breath sounds: Normal breath sounds.  Musculoskeletal:        General: Normal range of motion.     Cervical back: Normal range of motion.  Skin:    General: Skin is warm and dry.     Findings: No rash.  Neurological:     General: No focal deficit present.     Mental Status: She is alert.  Psychiatric:        Mood and Affect: Mood normal.      UC Treatments / Results  Labs (all labs ordered are listed, but only abnormal results are displayed) Labs Reviewed  COMPREHENSIVE METABOLIC PANEL - Abnormal; Notable for the following components:      Result Value   Creatinine, Ser 1.37 (*)    Total Bilirubin 1.3 (*)    GFR calc non Af Amer 42 (*)    GFR calc Af Amer 48 (*)    All other components within normal limits  VITAMIN D 25 HYDROXY (VIT D DEFICIENCY, FRACTURES) - Abnormal; Notable for the following components:   Vit D, 25-Hydroxy 19.98 (*)    All other components within normal limits  SARS CORONAVIRUS 2 (TAT 6-24 HRS)  CBC  LIPID PANEL  TSH    EKG   Radiology No results found.  Procedures Procedures (including critical care time)  Medications Ordered in UC Medications - No data to display  Initial Impression / Assessment and Plan / UC Course  I  have reviewed the triage vital signs and the  nursing notes.  Pertinent labs & imaging results that were available during my care of the patient were reviewed by me and considered in my medical decision making (see chart for details).     Medication refill Patient had recent lab work done approximate 1 year ago.  Recently ran out of medications and is currently in between primary care providers. No concerning signs or symptoms on exam today.  We will go ahead and refill medications for blood pressure, high cholesterol, anxiety, psoriasis and GERD. Patient plans to follow-up with primary care for further management of her chronic issues. Drawing some baseline lab work Final Clinical Impressions(s) / UC Diagnoses   Final diagnoses:  None     Discharge Instructions     I have refilled your medications.  We are doing some basic lab work Please follow-up with primary care doctor for further maintenance of your chronic medical conditions.     ED Prescriptions    Medication Sig Dispense Auth. Provider   citalopram (CELEXA) 40 MG tablet Take 1 tablet (40 mg total) by mouth daily. 90 tablet Audie Wieser A, NP   Clobetasol Propionate (CLOBETASOL 17 PROPIONATE) 0.5 % POWD Apply a thick coat to your hands and knees once or twice daily as directed for psoriasis 25 g Rogene Meth A, NP   lisinopril (ZESTRIL) 40 MG tablet Take 1 tablet (40 mg total) by mouth daily. 90 tablet Mahlet Jergens A, NP   metoprolol tartrate (LOPRESSOR) 50 MG tablet Take 1 tablet (50 mg total) by mouth 2 (two) times daily. 180 tablet Del Overfelt A, NP   atorvastatin (LIPITOR) 40 MG tablet Take 1 tablet (40 mg total) by mouth daily. 30 tablet Ladislav Caselli A, NP   hydrochlorothiazide (HYDRODIURIL) 12.5 MG tablet Take 1 tablet (12.5 mg total) by mouth daily. 30 tablet Dahlia Byes A, NP     PDMP not reviewed this encounter.   Janace Aris, NP 10/03/19 [phone]

## 2019-10-04 LAB — SARS CORONAVIRUS 2 (TAT 6-24 HRS): SARS Coronavirus 2: NEGATIVE

## 2019-10-24 ENCOUNTER — Other Ambulatory Visit: Payer: Self-pay

## 2019-10-24 ENCOUNTER — Ambulatory Visit: Payer: 59 | Admitting: Family Medicine

## 2019-10-24 ENCOUNTER — Encounter: Payer: Self-pay | Admitting: Family Medicine

## 2019-10-24 VITALS — BP 110/70 | HR 59 | Ht 61.0 in | Wt 109.4 lb

## 2019-10-24 DIAGNOSIS — F418 Other specified anxiety disorders: Secondary | ICD-10-CM

## 2019-10-24 DIAGNOSIS — M25561 Pain in right knee: Secondary | ICD-10-CM

## 2019-10-24 DIAGNOSIS — E785 Hyperlipidemia, unspecified: Secondary | ICD-10-CM

## 2019-10-24 DIAGNOSIS — K219 Gastro-esophageal reflux disease without esophagitis: Secondary | ICD-10-CM

## 2019-10-24 DIAGNOSIS — I10 Essential (primary) hypertension: Secondary | ICD-10-CM

## 2019-10-24 DIAGNOSIS — G8929 Other chronic pain: Secondary | ICD-10-CM | POA: Insufficient documentation

## 2019-10-24 DIAGNOSIS — Z23 Encounter for immunization: Secondary | ICD-10-CM | POA: Diagnosis not present

## 2019-10-24 MED ORDER — DICLOFENAC SODIUM 1 % EX GEL: 2.0000 g | Freq: Four times a day (QID) | CUTANEOUS | Status: DC | PRN

## 2019-10-24 MED ORDER — OMEPRAZOLE 20 MG PO CPDR: 20.0000 mg | DELAYED_RELEASE_CAPSULE | Freq: Every day | ORAL | 3 refills | Status: DC

## 2019-10-24 MED ORDER — METOPROLOL TARTRATE 50 MG PO TABS: 50.0000 mg | ORAL_TABLET | Freq: Two times a day (BID) | ORAL | 0 refills | Status: DC

## 2019-10-24 NOTE — Assessment & Plan Note (Signed)
Patient received flu vaccine today in clinic.

## 2019-10-24 NOTE — Progress Notes (Signed)
Subjective:    Patient ID: Sarah Pruitt, female    DOB: 09/17/1955, 64 y.o.   MRN: [id number]   CC: New Patient  HPI: Patient is 64yo female presenting to re-establish care with the clinic, she was previously seen by Dr. Jennette Kettle a few years ago but no longer had insurance and was seen by [facility] practices. She was able to get insurance again and wants to establish care with the practice. The patient has not had several of her medications for a while and recently got them filled at an Urgent Care until she could make an appointment.  Patient has a PMH significant for stroke at age 74 when she hit her head on a curb and was in a short coma for <24h with subsequent partial paralysis for 6 months. Other significant history includes HTN, depression, reflux, and hepatitis C (treated successfully at least 2 years ago). Patient reports that she would like to have her flu shot today.  Chronic right knee pain Patient reports that she has had right knee joint pain that occasionally in the last several years; she does not note any inciting event and that the pain is better with movement. Patient reports she was previously prescribed a short course of Meloxicam by [facility] with improvement. She is requesting another prescription to help with the pain.   Anxiety/Depression Patient reports that she has had anxiety/depression for years that is well controlled on Citalopram 40mg  daily. She is the head of her household (which includes her mother and brother). 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 She does not think that she needs counseling at this time, though she is aware of her many options if she needs any assistance.   HTN Patient reports that her blood pressures can reach a systolic measurement of 200 when she has not been taking her medications. She reports that about 3 weeks ago  she went to the urgent care for medications and that her pressures have been much better controlled since then. She has a cuff at home and takes her blood pressures regularly.  GERD Patient has a history of reflux treated successfully with PPI, currently requesting refill of omeprazole because it was sent to the wrong pharmacy by the urgent care.   Health Maintenance - Patient does not wish to get a colonoscopy and will think about Cologuard. - Mammogram completed 09/19/18 with recommended 1 year follow-up, will need this scheduled this year. - Pap smear: patient reports last pap smear "a long time ago". She has never had sexual intercourse and no exposure to HPV. She states that in 2020 a pap was attempted at [facility], but that they were unsuccessful as the speculum was too large.   PMHx: Past Medical History:  Diagnosis Date  . Allergy   . Anemia   . Hepatitis C   . Hepatitis C    pt states she was diagnosed and treated for Hep C at [facility]  . Malaria 1977  . Peripheral neuropathy    distal foot mostly (moderate), some hand (mild)     Surgical Hx: Past Surgical History:  Procedure Laterality Date  . LIVER BIOPSY  2005     Family Hx: Family History  Problem Relation Age of Onset  . Hypertension Mother   . Schizophrenia Sister      Social Hx: Current Social History  Who lives at home: Mother (34), brother (71), niece (21) 10/24/2019  Who would speak for you about health care matters: Mother 10/24/2019  Transportation: Owns a vehicle  10/24/2019 Important Relationships & Pets: Family as listed above 10/24/2019  Current Stressors: Family/home  10/24/2019 Work / Education:  Part time clerk at [employer] 10/24/2019 Religious / Personal Beliefs: N/a 10/24/2019 Interests / Fun: Joking  10/24/2019 Other: None  10/24/2019   Medications: .  atorvastatin (LIPITOR) 40 MG tablet, daily .  citalopram (CELEXA) 40 MG tablet, daily  .  Clobetasol Propionate (CLOBETASOL 17 PROPIONATE)  0.5 % POWD PRN for psoriasis .  hydrochlorothiazide (HYDRODIURIL) 12.5 MG tablet, daily .  lisinopril (ZESTRIL) 40 MG tablet, daily .  metoprolol tartrate (LOPRESSOR) 50 MG tablet, BID .  omeprazole (PRILOSEC) 20 MG daily    ROS: Woman:  Patient reports no  vision/ hearing changes,anorexia, weight change, fever ,adenopathy, persistant / recurrent hoarseness, swallowing issues, chest pain, edema,persistant / recurrent cough, hemoptysis, dyspnea(rest, exertional, paroxysmal nocturnal), gastrointestinal  bleeding (melena, rectal bleeding), abdominal pain, excessive heart burn, GU symptoms(dysuria, hematuria, pyuria, voiding/incontinence  Issues) syncope, focal weakness, severe memory loss, concerning skin lesions, abnormal bruising/bleeding, major joint swelling, breast masses or abnormal vaginal bleeding.   Reports: anxiety/depression, right knee pain   Preventative Screening Colonoscopy: Declines Mammogram: 2020. Left breast cyst, no evidence of breast carcinoma Pap test: unknown, patient has never had intercourse.  Smoking status reviewed (smokes 3-4 cigarettes daily)   Objective:  BP 110/70   Pulse (!) 59   Ht 5\' 1"  (1.549 m)   Wt 109 lb 6.4 oz (49.6 kg)   SpO2 99%   BMI 20.67 kg/m  Vitals and nursing note reviewed  General: well nourished, in no acute distress HEENT: normocephalic, TM's visualized bilaterally, no scleral icterus or conjunctival pallor, no nasal discharge, moist mucous membranes, good dentition without erythema or discharge noted in posterior oropharynx Neck: supple, non-tender, without lymphadenopathy Cardiac: RRR, clear S1 and S2, no murmurs, rubs, or gallops Respiratory: clear to auscultation bilaterally, no increased work of breathing Abdomen: soft, nontender, nondistended, no masses or organomegaly. Bowel sounds present Extremities: no edema or cyanosis. Warm, well perfused. 2+ radial and PT pulses bilaterally. Psoriasis noted on b/l knees as shown in  picture.   Skin: warm and dry Neuro: alert and oriented, no focal deficits   Assessment & Plan:    Hypertension Continue on current medications: HCTZ 12.5 daily, Lopressor 20mg  BID, Lisinopril 40mg  daily. -Keep BP log and bring in with next appointment to determine need for medication adjustment.   GERD (gastroesophageal reflux disease) Patient stable, taking omeprazole 20mg  daily, refilling prescription today.  Chronic pain of right knee Patient has has right knee pain for several years with no inciting trauma described. Previously placed on a short course of Meloxicam, current concerns about kidney function with Cr of 1.37 and GFR of 42 on last labs.  - Can take OTC Tylenol for pain - Voltaren gel prescribed (or OTC) for breakthrough pain.  Hyperlipidemia Patient states that she had her labs drawn several weeks ago and that the nurse called her and told her to stop taking her Lipitor 40mg . -Advised patient to take 20mg  of Lipitor and we will re-evaluate with repeat labs in 1 year to determine need for medication.  Anxious depression Patient has many life stressors, states that she is currently stable on Celexa 40mg . She does smoke as a coping mechanism. Stressors mainly from family and being the main income in the household. Discussed  with patient options for counseling and other resources, patient declined at this time but is aware of her options.  - Continue Celexa 40mg   Need for immunization against influenza Patient received flu vaccine today in clinic.    Return in about 6 months (around 04/22/2020), or as needed.   06/22/2020, DO, PGY 1 [facility]

## 2019-10-24 NOTE — Assessment & Plan Note (Signed)
Continue on current medications: HCTZ 12.5 daily, Lopressor 20mg  BID, Lisinopril 40mg  daily. -Keep BP log and bring in with next appointment to determine need for medication adjustment.

## 2019-10-24 NOTE — Assessment & Plan Note (Signed)
Patient stable, taking omeprazole 20mg  daily, refilling prescription today.

## 2019-10-24 NOTE — Assessment & Plan Note (Signed)
Patient states that she had her labs drawn several weeks ago and that the nurse called her and told her to stop taking her Lipitor 40mg . -Advised patient to take 20mg  of Lipitor and we will re-evaluate with repeat labs in 1 year to determine need for medication.

## 2019-10-24 NOTE — Assessment & Plan Note (Signed)
Patient has many life stressors, states that she is currently stable on Celexa 40mg . She does smoke as a coping mechanism. Stressors mainly from family and being the main income in the household. Discussed with patient options for counseling and other resources, patient declined at this time but is aware of her options.  - Continue Celexa 40mg 

## 2019-10-24 NOTE — Patient Instructions (Addendum)
It was so great to meet you today! Today we discussed:  High blood pressure - Try to take your pressures at home and keep a log of at least 1 reading per day and bring to your next appointment. - When you need medication refills please let me know.  Health Screenings - I recommend getting tested with Cologuard which is a screening tool to determine if we need a colonoscopy, a colonoscopy is preferred but this can help [place] see if there is any blood in your stool that needs addressing.  - The doctor who did your mammogram last year recommended a 1 year repeat, so we will need to schedule that sometime soon.  High Cholesterol - I don't want you to stop your Lipitor all together yet, decrease the dose to 20mg  (you can cut your pill in half to use what you have already). - We will recheck your cholesterol levels in 1 year and determine if you still need the medication at that time.  Knee Pain - You can use over the counter Tylenol 500mg  as instructed on the bottle -I am prescribing you Voltaren gel, but it is also available over the counter if your insurance does not cover the cost. You can use this for breakthrough pain when the Tylenol is not enough.

## 2019-10-24 NOTE — Assessment & Plan Note (Signed)
Patient has has right knee pain for several years with no inciting trauma described. Previously placed on a short course of Meloxicam, current concerns about kidney function with Cr of 1.37 and GFR of 42 on last labs.  - Can take OTC Tylenol for pain - Voltaren gel prescribed (or OTC) for breakthrough pain.

## 2020-01-15 ENCOUNTER — Other Ambulatory Visit: Payer: Self-pay

## 2020-01-15 DIAGNOSIS — F418 Other specified anxiety disorders: Secondary | ICD-10-CM

## 2020-01-15 MED ORDER — CITALOPRAM HYDROBROMIDE 40 MG PO TABS: 40.0000 mg | ORAL_TABLET | Freq: Every day | ORAL | 3 refills | Status: DC

## 2020-03-16 ENCOUNTER — Other Ambulatory Visit: Payer: Self-pay | Admitting: Family Medicine

## 2020-03-16 DIAGNOSIS — K219 Gastro-esophageal reflux disease without esophagitis: Secondary | ICD-10-CM

## 2020-04-11 ENCOUNTER — Other Ambulatory Visit: Payer: Self-pay | Admitting: Family Medicine

## 2020-04-11 DIAGNOSIS — I10 Essential (primary) hypertension: Secondary | ICD-10-CM

## 2020-04-24 ENCOUNTER — Other Ambulatory Visit: Payer: Self-pay | Admitting: Family Medicine

## 2020-04-24 DIAGNOSIS — K219 Gastro-esophageal reflux disease without esophagitis: Secondary | ICD-10-CM

## 2020-05-07 ENCOUNTER — Encounter: Payer: Self-pay | Admitting: Family Medicine

## 2020-05-07 ENCOUNTER — Ambulatory Visit: Payer: 59 | Admitting: Family Medicine

## 2020-05-07 ENCOUNTER — Other Ambulatory Visit: Payer: Self-pay

## 2020-05-07 VITALS — BP 182/84 | HR 59 | Wt 111.8 lb

## 2020-05-07 DIAGNOSIS — R7989 Other specified abnormal findings of blood chemistry: Secondary | ICD-10-CM

## 2020-05-07 DIAGNOSIS — F418 Other specified anxiety disorders: Secondary | ICD-10-CM | POA: Diagnosis not present

## 2020-05-07 DIAGNOSIS — I10 Essential (primary) hypertension: Secondary | ICD-10-CM

## 2020-05-07 DIAGNOSIS — Z720 Tobacco use: Secondary | ICD-10-CM

## 2020-05-07 MED ORDER — HYDROCHLOROTHIAZIDE 25 MG PO TABS: 25.0000 mg | ORAL_TABLET | Freq: Every day | ORAL | 3 refills | Status: DC

## 2020-05-07 NOTE — Patient Instructions (Signed)
Please make sure to get a blood pressure cuff and check your pressures at home. Please keep a log and bring it to an appointment within the next 1-2 weeks. We are increasing your hydrochlorothiazide today to 25mg  daily. If you have any worsening of your headaches, feeling lightheaded or like you are going to pass out and have persistent blurry vision please go to the ER or an urgent care.

## 2020-05-07 NOTE — Progress Notes (Signed)
    SUBJECTIVE:   CHIEF COMPLAINT / HPI:   HTN Patient reports that she has been out of her HCTZ for several months now that she has been requesting for the pharmacy to send prescription request in.  She states that she was previously out of metoprolol but that was recently refilled.  Patient did state that she is having some episodes where she wakes up in the morning and has tension in the back of her neck as well as blurry vision and some swelling of her hands with associated "tingling".  She states that these episodes tend to happen after she eats a lot of salt at night or a lot of snacks.  She also states that she was previously taking her blood pressures but that her machine broke a week after she got it (that was right after her last visit several months ago).  Patient is also reporting some decreased urinary frequency associated with decreased oral intake of water and that when she is working, she may not use the restroom the entire 8 hours.  She states that the HCTZ helps her with urination.  Depression/anxiety Patient's PHQ-9 today is 7, she states that she copes with her anxiety by smoking.  She currently is the sole caretaker for her mother and brother and is also the breadwinner of the family, which creates a lot of stress for her.  PERTINENT  PMH / PSH: Anxiety/depression, HTN, hyperlipidemia, prior episode of syncope, tobacco use  OBJECTIVE:   BP (!) 182/84   Pulse (!) 59   Wt 111 lb 12.8 oz (50.7 kg)   SpO2 97%   BMI 21.12 kg/m   Gen: well-appearing, NAD, sitting up in clinic Resp: Comfortable on room air, no increased work of breathing Extremities: No obvious peripheral edema present, patient able to move all extremities equally and appropriately  ASSESSMENT/PLAN:   HTN BP elevated in office 22/84.  Patient states that she has episodic blurry vision/headaches/hand swelling, which she relates to poor diet with increased water intake.  I am concerned the patient's blood  pressure is significantly elevated past today's reading when she is having these episodes as she states that she is currently asymptomatic.  Patient's last BMP that was checked was on 10/03/2019 and was 1.37, which was elevated from prior readings with an average creatinine of 0.8.  Patient will need close follow-up. - BMP today to check patient's kidney function - Increased HCTZ to 25 mg - Patient instructed to get blood pressure cuff and keep blood pressure log - Patient scheduled for appointment in 1 week with Dr. Robyne Peers - If blood pressure remains consistently elevated despite new medications, consideration to be given to start amlodipine.   Depression/anxiety PHQ-9 7 today with a negative #9.  Patient currently on citalopram 40 mg daily and also smokes as a coping medicine. -Continue citalopram 40 mg  Tobacco use -Cessation counseling  Evelena Leyden, DO [facility]

## 2020-05-08 LAB — BASIC METABOLIC PANEL
BUN/Creatinine Ratio: 15 (ref 12–28)
BUN: 18 mg/dL (ref 8–27)
CO2: 24 mmol/L (ref 20–29)
Calcium: 9.7 mg/dL (ref 8.7–10.3)
Chloride: 101 mmol/L (ref 96–106)
Creatinine, Ser: 1.21 mg/dL — ABNORMAL HIGH (ref 0.57–1.00)
Glucose: 93 mg/dL (ref 65–99)
Potassium: 4.8 mmol/L (ref 3.5–5.2)
Sodium: 139 mmol/L (ref 134–144)
eGFR: 51 mL/min/{1.73_m2} — ABNORMAL LOW (ref >59)

## 2020-05-11 ENCOUNTER — Encounter: Payer: Self-pay | Admitting: Family Medicine

## 2020-05-14 ENCOUNTER — Encounter: Payer: Self-pay | Admitting: Family Medicine

## 2020-05-14 ENCOUNTER — Ambulatory Visit: Payer: 59 | Admitting: Family Medicine

## 2020-05-14 ENCOUNTER — Other Ambulatory Visit: Payer: Self-pay

## 2020-05-14 ENCOUNTER — Ambulatory Visit (INDEPENDENT_AMBULATORY_CARE_PROVIDER_SITE_OTHER): Payer: 59

## 2020-05-14 DIAGNOSIS — Z609 Problem related to social environment, unspecified: Secondary | ICD-10-CM | POA: Diagnosis not present

## 2020-05-14 DIAGNOSIS — Z23 Encounter for immunization: Secondary | ICD-10-CM | POA: Diagnosis not present

## 2020-05-14 DIAGNOSIS — I10 Essential (primary) hypertension: Secondary | ICD-10-CM | POA: Diagnosis not present

## 2020-05-14 NOTE — Progress Notes (Signed)
    SUBJECTIVE:   CHIEF COMPLAINT / HPI:   Unsafe living situation Patient reports that she fears for her safety at home. She lives with her mother and brother, endorses feeling consistently unsafe and watchful while at home. States that this has been her situation for over 20 years since she moved to the [place]. Her brother has schizophrenia and sometimes gets violent. Denies having a support system as she takes care of her mother. Does not like for [place] to do anything for her today although offered support for now and in the future. States that she just wants things to go smoothly and does not want to create any trouble. This makes her stressed. Denies SI and HI.    PERTINENT  PMH / PSH:   Hypertension During patient's last visit, BP noted to be 182/84. At this time she was also experiencing some vision changes, headaches and UE swelling. Has endorsed two episodes of blurry vision since her last clinic visit that have rapidly and spontaneously resolved. She brings in home BP readings that range with systolic BP 157-186. Denies chest pain, dyspnea, palpitations and edema. States that she does not stay hydrated throughout the day. Does not have dedicated time in the day that she exercises but endorses that she remains active throughout the day. Eats a very balanced diet with plenty of vegetables with fish at times but does not typically prefer to eat meat as she states she does not enjoy eating it. Endorses she is a current smoker.   OBJECTIVE:   BP (!) 160/80   Pulse (!) 56   Ht 5\' 1"  (1.549 m)   Wt 113 lb 4 oz (51.4 kg)   SpO2 97%   BMI 21.40 kg/m   General: Patient well-appearing, in no acute distress. CV: RRR, no murmurs or gallops auscultated Resp: CTAB, no wheezing, rales or rhonchi noted Abdomen: soft, nontender, BS+ Ext: mild non-pitting LE edema noted bilaterally, radial pulses strong and equal bilaterally Neuro: normal gait, appropriately conversational Psych: mood appropriate,  denies SI and HI, very pleasant   ASSESSMENT/PLAN:   Hypertension -BP 160/70 and remains similar upon recheck -instructed to continue current anti-hypertensive regimen without changes today -instructed to bring in all home medications at next appointment -encouraged to continue to record daily BP readings -instructed to bring in home BP cuff and monitor in order to note any potential discrepancies in measurement -strict instructions provided on home BP recordings -diet and exercise counseling provided, encouraged to maintain low salt intake and adequate hydration -f/u in 1 week for BP check, consider addition of amlodipine or spironolactone if BP not at goal    High risk social situation -PHQ-9 score of 6 with negative question 9 reviewed and discussed -although stable, seems to be chronic and ongoing situation -given patient preference, no intervention performed, reassurance provided   Health maintenance -COVID booster received today, counseled on continuing safe social distance practices.   , DO [facility]

## 2020-05-14 NOTE — Assessment & Plan Note (Addendum)
-  PHQ-9 score of 6 with negative question 9 reviewed and discussed -although stable, seems to be chronic and ongoing situation -given patient preference, no intervention performed, reassurance provided

## 2020-05-14 NOTE — Patient Instructions (Signed)
It was great seeing you today!  Today we discussed your blood pressure and we did not make any changes to your medication. Try to continue to eat a balanced diet full of vegetables and lean meats. Drink at least 7-8 cups of water to ensure you are staying hydrated. Please check your BP daily and bring these recordings in next time.  Please bring your medications to your next visit. Also please bring your blood pressure cuff and monitor along with recordings of your daily blood pressure readings to your next appointment.  Please follow up in 1 week for your next scheduled appointment, if anything arises between now and then, please don't hesitate to contact our office.   Thank you for allowing [place] to be a part of your medical care!  Thank you, Dr. Robyne Peers   Managing Your Hypertension Hypertension, also called high blood pressure, is when the force of the blood pressing against the walls of the arteries is too strong. Arteries are blood vessels that carry blood from your heart throughout your body. Hypertension forces the heart to work harder to pump blood and may cause the arteries to become narrow or stiff. Understanding blood pressure readings Your personal target blood pressure may vary depending on your medical conditions, your age, and other factors. A blood pressure reading includes a higher number over a lower number. Ideally, your blood pressure should be below 120/80. You should know that:  The first, or top, number is called the systolic pressure. It is a measure of the pressure in your arteries as your heart beats.  The second, or bottom number, is called the diastolic pressure. It is a measure of the pressure in your arteries as the heart relaxes. Blood pressure is classified into four stages. Based on your blood pressure reading, your health care provider may use the following stages to determine what type of treatment you need, if any. Systolic pressure and diastolic pressure are  measured in a unit called mmHg. Normal  Systolic pressure: below 120.  Diastolic pressure: below 80. Elevated  Systolic pressure: 120-129.  Diastolic pressure: below 80. Hypertension stage 1  Systolic pressure: 130-139.  Diastolic pressure: 80-89. Hypertension stage 2  Systolic pressure: 140 or above.  Diastolic pressure: 90 or above. How can this condition affect me? Managing your hypertension is an important responsibility. Over time, hypertension can damage the arteries and decrease blood flow to important parts of the body, including the brain, heart, and kidneys. Having untreated or uncontrolled hypertension can lead to:  A heart attack.  A stroke.  A weakened blood vessel (aneurysm).  Heart failure.  Kidney damage.  Eye damage.  Metabolic syndrome.  Memory and concentration problems.  Vascular dementia. What actions can I take to manage this condition? Hypertension can be managed by making lifestyle changes and possibly by taking medicines. Your health care provider will help you make a plan to bring your blood pressure within a normal range. Nutrition  Eat a diet that is high in fiber and potassium, and low in salt (sodium), added sugar, and fat. An example eating plan is called the Dietary Approaches to Stop Hypertension (DASH) diet. To eat this way: ? Eat plenty of fresh fruits and vegetables. Try to fill one-half of your plate at each meal with fruits and vegetables. ? Eat whole grains, such as whole-wheat pasta, brown rice, or whole-grain bread. Fill about one-fourth of your plate with whole grains. ? Eat low-fat dairy products. ? Avoid fatty cuts of meat, processed  or cured meats, and poultry with skin. Fill about one-fourth of your plate with lean proteins such as fish, chicken without skin, beans, eggs, and tofu. ? Avoid pre-made and processed foods. These tend to be higher in sodium, added sugar, and fat.  Reduce your daily sodium intake. Most people  with hypertension should eat less than 1,500 mg of sodium a day.   Lifestyle  Work with your health care provider to maintain a healthy body weight or to lose weight. Ask what an ideal weight is for you.  Get at least 30 minutes of exercise that causes your heart to beat faster (aerobic exercise) most days of the week. Activities may include walking, swimming, or biking.  Include exercise to strengthen your muscles (resistance exercise), such as weight lifting, as part of your weekly exercise routine. Try to do these types of exercises for 30 minutes at least 3 days a week.  Do not use any products that contain nicotine or tobacco, such as cigarettes, e-cigarettes, and chewing tobacco. If you need help quitting, ask your health care provider.  Control any long-term (chronic) conditions you have, such as high cholesterol or diabetes.  Identify your sources of stress and find ways to manage stress. This may include meditation, deep breathing, or making time for fun activities.   Alcohol use  Do not drink alcohol if: ? Your health care provider tells you not to drink. ? You are pregnant, may be pregnant, or are planning to become pregnant.  If you drink alcohol: ? Limit how much you use to:  0-1 drink a day for women.  0-2 drinks a day for [place]. ? Be aware of how much alcohol is in your drink. In the [place] one drink equals one 12 oz bottle of beer (355 mL), one 5 oz glass of wine (148 mL), or one 1 oz glass of hard liquor (44 mL). Medicines Your health care provider may prescribe medicine if lifestyle changes are not enough to get your blood pressure under control and if:  Your systolic blood pressure is 130 or higher.  Your diastolic blood pressure is 80 or higher. Take medicines only as told by your health care provider. Follow the directions carefully. Blood pressure medicines must be taken as told by your health care provider. The medicine does not work as well when you skip doses.  Skipping doses also puts you at risk for problems. Monitoring Before you monitor your blood pressure:  Do not smoke, drink caffeinated beverages, or exercise within 30 minutes before taking a measurement.  Use the bathroom and empty your bladder (urinate).  Sit quietly for at least 5 minutes before taking measurements. Monitor your blood pressure at home as told by your health care provider. To do this:  Sit with your back straight and supported.  Place your feet flat on the floor. Do not cross your legs.  Support your arm on a flat surface, such as a table. Make sure your upper arm is at heart level.  Each time you measure, take two or three readings one minute apart and record the results. You may also need to have your blood pressure checked regularly by your health care provider.   General information  Talk with your health care provider about your diet, exercise habits, and other lifestyle factors that may be contributing to hypertension.  Review all the medicines you take with your health care provider because there may be side effects or interactions.  Keep all visits as told  by your health care provider. Your health care provider can help you create and adjust your plan for managing your high blood pressure. Where to find more information  National Heart, Lung, and Blood Institute: PopSteam.is  American Heart Association: www.heart.org Contact a health care provider if:  You think you are having a reaction to medicines you have taken.  You have repeated (recurrent) headaches.  You feel dizzy.  You have swelling in your ankles.  You have trouble with your vision. Get help right away if:  You develop a severe headache or confusion.  You have unusual weakness or numbness, or you feel faint.  You have severe pain in your chest or abdomen.  You vomit repeatedly.  You have trouble breathing. These symptoms may represent a serious problem that is an  emergency. Do not wait to see if the symptoms will go away. Get medical help right away. Call your local emergency services (911 in the [place] Do not drive yourself to the hospital. Summary  Hypertension is when the force of blood pumping through your arteries is too strong. If this condition is not controlled, it may put you at risk for serious complications.  Your personal target blood pressure may vary depending on your medical conditions, your age, and other factors. For most people, a normal blood pressure is less than 120/80.  Hypertension is managed by lifestyle changes, medicines, or both.  Lifestyle changes to help manage hypertension include losing weight, eating a healthy, low-sodium diet, exercising more, stopping smoking, and limiting alcohol. This information is not intended to replace advice given to you by your health care provider. Make sure you discuss any questions you have with your health care provider. Document Revised: 03/15/2019 Document Reviewed: 01/08/2019 Elsevier Patient Education  2021 [employer].

## 2020-05-14 NOTE — Assessment & Plan Note (Signed)
-  BP 160/70 and remains similar upon recheck -instructed to continue current anti-hypertensive regimen without changes today -instructed to bring in all home medications at next appointment -encouraged to continue to record daily BP readings -instructed to bring in home BP cuff and monitor in order to note any potential discrepancies in measurement -strict instructions provided on home BP recordings -diet and exercise counseling provided, encouraged to maintain low salt intake and adequate hydration -f/u in 1 week for BP check, consider addition of amlodipine or spironolactone if BP not at goal

## 2020-05-20 ENCOUNTER — Telehealth: Payer: Self-pay | Admitting: Family Medicine

## 2020-05-20 ENCOUNTER — Other Ambulatory Visit: Payer: Self-pay

## 2020-05-20 ENCOUNTER — Ambulatory Visit: Payer: 59 | Admitting: Family Medicine

## 2020-05-20 VITALS — BP 140/80 | HR 61 | Ht 62.0 in | Wt 112.4 lb

## 2020-05-20 DIAGNOSIS — I10 Essential (primary) hypertension: Secondary | ICD-10-CM

## 2020-05-20 DIAGNOSIS — M1711 Unilateral primary osteoarthritis, right knee: Secondary | ICD-10-CM

## 2020-05-20 DIAGNOSIS — M1712 Unilateral primary osteoarthritis, left knee: Secondary | ICD-10-CM | POA: Diagnosis not present

## 2020-05-20 DIAGNOSIS — Z609 Problem related to social environment, unspecified: Secondary | ICD-10-CM | POA: Diagnosis not present

## 2020-05-20 DIAGNOSIS — M1990 Unspecified osteoarthritis, unspecified site: Secondary | ICD-10-CM | POA: Insufficient documentation

## 2020-05-20 MED ORDER — DICLOFENAC SODIUM 1 % EX GEL
2.0000 g | Freq: Four times a day (QID) | CUTANEOUS | 3 refills | Status: DC
Start: 2020-05-20 — End: 2023-04-26

## 2020-05-20 NOTE — Telephone Encounter (Signed)
Called [facility] [employer], no answer. Left voicemail for Jonn Shingles ([facility] Social Work Supervisor). Awaiting to hear back, will attempt to try again.

## 2020-05-20 NOTE — Assessment & Plan Note (Signed)
-  likely due to normal age-related changes, possibly osteoarthritis -prescribed voltaren gel, instructed on use -very low suspicion of meniscus tear or fracture/dislocation, consider imaging if worsening

## 2020-05-20 NOTE — Patient Instructions (Addendum)
It was great seeing you today!  Today we discussed your blood pressure. We did not make any medication changes today, please continue to take all your medications as prescribed. Please continue to eat plenty of vegetables and remain physical active throughout the day.  For your knee pain, I believe osteoarthritis is a major component that is contributing to your pain. I have prescribed voltaren gel, please apply this to the affected area.  Please follow up in 1 month for your next scheduled appointment, if anything arises between now and then, please don't hesitate to contact our office.   Thank you for allowing [place] to be a part of your medical care!  Thank you, Dr. Robyne Peers

## 2020-05-20 NOTE — Assessment & Plan Note (Addendum)
-  called APS to report given concerns for patient and her mother, spoke to Gentry Fitz with [facility] to ensure patient's brother is able to receive the appropriate resources and therapy he may need that will benefit both him and the rest of the family. Discussed this with patient who agrees.  -reassurance provided

## 2020-05-20 NOTE — Assessment & Plan Note (Signed)
-  BP discrepancy noted with patient's home cuff and manual cuff in the office, manual BP 132/68 and 140/80 on repeat -no medication changes at this time, continue all current anti-hypertensives as prescribed -diet and exercise counseling -instructed to only check BP if symptomatic, discussed possibly purchasing a new cuff as her current one is inaccurate  -f/u in 1 month, consider addition of spironolactone if remains above goal

## 2020-05-20 NOTE — Telephone Encounter (Signed)
Called patient to discuss that I will be calling APS given that she has safety concerns at home along with her 63 year old mother who is also in the house. States that mother is relatively independent but the patient helps out with some tasks such as cooking. Discussed with patient my legal obligation and that my purpose in this is to ensure that both her and her family has the appropriate resources she needs to maintain a healthy lifestyle. Patient understands and denies any further questions or concerns. Discussed case with Dr. Shawnee Knapp.

## 2020-05-20 NOTE — Progress Notes (Signed)
SUBJECTIVE:   CHIEF COMPLAINT / HPI:   Right knee pain Endorsing right knee pain that has been occurring for that past 10 years. Rates pain 3/10. Does not interfere with her activities of daily living or quality of life but does endorse occasional pain when walking. No recent injury or trauma noted. Does not use walker or cane to assist with ambulation. Denies any radiating pain or sensation changes. Currently takes tylenol for pain which resolves the pain for a few hours before returning.     PERTINENT  PMH / PSH:   Hypertension Patient presents for blood pressure check. Has been monitoring BP at home, systolic BP ranging from 160-180. Denies chest pain, dyspnea, vision changes and dizziness. Endorsing occasional leg swelling. Continues to incorporate plenty of vegetables into her diet and remains physical active throughout her day. Brought in her BP cuff today along with her all her medications. Current antihypertensives include lisinopril 40 mg daily, hydrochlorothiazide 25 mg daily and metoprolol tartrate 50 mg bid daily.   Living situation Patient describing current living situation as better compared to last visit. Lives with brother and mother. Mother is 65 years old and highly functional, able to independently complete tasks. Patient helps with running the household by cooking and cleaning. Brother has 30 year history of schizophrenia and is relatively functional, ongoing concerns about him being violent but this has not occurred in months. Patient continues to try to help her mother and brother out while also keeping the peace at home. Reports that brother has been sleeping more and been more calm.    OBJECTIVE:   BP 140/80 (BP Location: Left Arm) Comment: measured by Clabe Seal, CMA.  Pulse 61   Ht 5\' 2"  (1.575 m)   Wt 112 lb 6.4 oz (51 kg)   SpO2 98%   BMI 20.56 kg/m   General: Patient well-appearing, in no acute distress. CV: RRR, no murmurs or gallops auscultated Resp:  CTAB, no rales or rhonchi noted MSK: negative anterior drawer test, 4-5 cm psoriasis patch noted along the knees bilaterally, no other rash or edema noted along knees bilaterally, non-tender on palpation along patella, medial or lateral portion, no crepitus noted, normal active and passive ROM along both knee joints bilaterally, no tenderness along popliteal fossa bilaterally Ext: radial pulses strong and equal bilaterally, no LE edema noted bilaterally   Neuro: normal gait without limitation, gross sensation intact, 5/5 grip strength, 5/5 UE and LE strength bilaterally Psych: mood appropriate, no SI  ASSESSMENT/PLAN:   Osteoarthritis -likely due to normal age-related changes, possibly osteoarthritis -prescribed voltaren gel, instructed on use -very low suspicion of meniscus tear or fracture/dislocation, consider imaging if worsening   Hypertension -BP discrepancy noted with patient's home cuff and manual cuff in the office, manual BP 132/68 and 140/80 on repeat -no medication changes at this time, continue all current anti-hypertensives as prescribed -diet and exercise counseling -instructed to only check BP if symptomatic, discussed possibly purchasing a new cuff as her current one is inaccurate  -f/u in 1 month, consider addition of spironolactone if remains above goal   High risk social situation -called APS to report given concerns for patient and her mother, spoke to with [facility] to ensure patient's brother is able to receive the appropriate resources and therapy he may need that will benefit both him and the rest of the family. Discussed this with patient who agrees.  -reassurance provided      [place], DO [facility]  [place]

## 2020-06-22 ENCOUNTER — Other Ambulatory Visit: Payer: Self-pay

## 2020-06-22 ENCOUNTER — Encounter: Payer: Self-pay | Admitting: Family Medicine

## 2020-06-22 ENCOUNTER — Ambulatory Visit: Payer: 59 | Admitting: Family Medicine

## 2020-06-22 VITALS — BP 118/74 | HR 68 | Ht 62.0 in | Wt 111.4 lb

## 2020-06-22 DIAGNOSIS — I10 Essential (primary) hypertension: Secondary | ICD-10-CM

## 2020-06-22 DIAGNOSIS — Z23 Encounter for immunization: Secondary | ICD-10-CM | POA: Diagnosis not present

## 2020-06-22 DIAGNOSIS — G8929 Other chronic pain: Secondary | ICD-10-CM

## 2020-06-22 DIAGNOSIS — M25561 Pain in right knee: Secondary | ICD-10-CM | POA: Diagnosis not present

## 2020-06-22 DIAGNOSIS — E7849 Other hyperlipidemia: Secondary | ICD-10-CM

## 2020-06-22 DIAGNOSIS — M545 Low back pain, unspecified: Secondary | ICD-10-CM

## 2020-06-22 MED ORDER — ATORVASTATIN CALCIUM 40 MG PO TABS: 40.0000 mg | ORAL_TABLET | Freq: Every day | ORAL | 3 refills | Status: DC

## 2020-06-22 NOTE — Patient Instructions (Signed)
Your blood pressure looks great today, keep up the good work!  I have sent in your prescription for the Lipitor, please let me know if there are any issues filling it.   For your knee and back pain, continue to use the Voltaren gel for now and come back for your appointment next week and we will do another treatment for your back to see if it continues to help. Heating pads can also help this pain, so feel free to see if that will help loosen the muscles.

## 2020-06-22 NOTE — Progress Notes (Signed)
    SUBJECTIVE:   CHIEF COMPLAINT / HPI:   Knee and back pain Patient continues to have back and right knee pain. She reports that the pain is typically a 2-3/10, over the weekend her pain worsened in her back to a 5-6/10 and it was difficult for her to be able to straighten out her back. She took Tylenol and laid down for 2-3 hours with some improvement in her symptoms. Patient reports she has previously been on Tramadol for the pain many years ago. Currently using voltaren gel with some improvement.   HTN Blood pressure is much better. She has been asymptomatic and only checks her blood pressure when she is concerned that it may be high and has not had to check it recently.   PERTINENT  PMH / PSH: Chronic lower back pain, OA, Psoriasis, HTN  OBJECTIVE:   BP 118/74   Pulse 68   Ht 5\' 2"  (1.575 m)   Wt 111 lb 6.4 oz (50.5 kg)   SpO2 96%   BMI 20.38 kg/m   General: well appearing, sitting up in clinic, pleasant and in good spirits Respiratory: speaks in full sentences, breathing comfortably MSK: right ASIS inferior, right positive seated flexion flexion test, right PSIS superior to the left. Pain with palpation of the right sacral sulcus. Right knee palpation with some crepitus with passive and active movement, tenderness to palpation of medial joint line, no erythema, no pain with passive or active ROM  ASSESSMENT/PLAN:   Back pain Appears MSK in origin given physical exam findings. Muscle energy OMM used to treat anterior innominate. Patient tolerated the exam and treatment well with improvement upon further palpation and walking; patient felt she could stand all the way up, which was improved.  - Continue current Tylenol and Voltaren gel - Return in 1 week for further management with another treatment if necessary - Counsel patient on appropriate   Knee pain  Pain overall well controlled with Tylenol and Voltaren gel. Pain may be related to developing OA, consideration to get  imaging if the pain worsens or is no longer controlled with current treatment.   HTN Patient BP in the office significantly improved from prior visits and is currently 118/74, which is within goal. Will continue to monitor with consideration to add spironolactone if worsening.  - Continue to monitor - Patient counseled to check BP if symptomatic   [place], DO [facility]

## 2020-07-01 ENCOUNTER — Encounter: Payer: Self-pay | Admitting: Family Medicine

## 2020-07-01 ENCOUNTER — Ambulatory Visit: Payer: 59 | Admitting: Family Medicine

## 2020-07-01 ENCOUNTER — Other Ambulatory Visit: Payer: Self-pay

## 2020-07-01 VITALS — BP 104/62 | HR 69 | Ht 62.0 in | Wt 112.8 lb

## 2020-07-01 DIAGNOSIS — G8929 Other chronic pain: Secondary | ICD-10-CM

## 2020-07-01 DIAGNOSIS — N1831 Chronic kidney disease, stage 3a: Secondary | ICD-10-CM | POA: Diagnosis not present

## 2020-07-01 DIAGNOSIS — I10 Essential (primary) hypertension: Secondary | ICD-10-CM | POA: Diagnosis not present

## 2020-07-01 DIAGNOSIS — M545 Low back pain, unspecified: Secondary | ICD-10-CM | POA: Diagnosis not present

## 2020-07-01 DIAGNOSIS — K219 Gastro-esophageal reflux disease without esophagitis: Secondary | ICD-10-CM | POA: Diagnosis not present

## 2020-07-01 DIAGNOSIS — N183 Chronic kidney disease, stage 3 unspecified: Secondary | ICD-10-CM | POA: Insufficient documentation

## 2020-07-01 MED ORDER — OMEPRAZOLE 20 MG PO CPDR: 1.0000 | DELAYED_RELEASE_CAPSULE | Freq: Every day | ORAL | 3 refills | Status: DC

## 2020-07-01 NOTE — Progress Notes (Signed)
    SUBJECTIVE:   CHIEF COMPLAINT / HPI:   GERD Patient requesting refill for her omeprazole. Does feel like her symptoms are usually well controlled, but occasionally has increased reflux at night.   Back pain Has resolved since the OMM treatment last week, she reports no pain with her movements or with sleep and that she feels she can "move more freely".  Social Patient reports that her boss is retiring at the end of the month and that she is now looking for a job, which has caused her some stress and staying up at night, though she states she has had worse times and is doing okay. Her mother is excited about her being home more and the patient wants to get a job from home.   PERTINENT  PMH / PSH: Reviewed  OBJECTIVE:   BP 104/62   Pulse 69   Ht 5\' 2"  (1.575 m)   Wt 112 lb 12.8 oz (51.2 kg)   SpO2 97%   BMI 20.63 kg/m   Gen: well-appearing, NAD, well-nourished Pulm: breathing comfortably, no respiratory distress, no increased WOB MSK: lumbar back and sacroiliac area no longer tender to palpation, symmetric movement of PSIS on sitting forward flexion test.  ASSESSMENT/PLAN:   GERD - refilled omeprazole  Back pain Improvement in physical exam and symptoms with prior OMM treatment. Patient without pain and instructed to return for further treatment if recurrent.   Healthcare maintenance Need to further discuss mammogram and colonoscopy with patient at next visit.  HTN Significantly improved and stable on Lisinopril 40mg  and HCTZ 25mg . - Continue current medications  , DO [place] [facility]

## 2020-07-01 NOTE — Patient Instructions (Signed)
Everything looks great right now, I am so glad that your back pain is better since the treatment. If it starts to come back, just schedule an appointment and we can do another treatment. Your blood pressure looks great today as long as you don't feel like you are getting dizzy then we are doing well.   If you have any issues with your stress or depression given your boss retiring, know that we are here for you and will support you however we can.  I have refilled your omeprazole, let me know if there are any issues with getting the prescription.

## 2020-07-02 ENCOUNTER — Other Ambulatory Visit: Payer: Self-pay | Admitting: Family Medicine

## 2020-07-02 DIAGNOSIS — I10 Essential (primary) hypertension: Secondary | ICD-10-CM

## 2020-10-03 ENCOUNTER — Other Ambulatory Visit: Payer: Self-pay | Admitting: Family Medicine

## 2020-10-03 DIAGNOSIS — E7849 Other hyperlipidemia: Secondary | ICD-10-CM

## 2020-10-29 ENCOUNTER — Other Ambulatory Visit: Payer: Self-pay

## 2020-10-30 MED ORDER — LISINOPRIL 40 MG PO TABS: 40.0000 mg | ORAL_TABLET | Freq: Every day | ORAL | 3 refills | Status: DC

## 2021-01-29 ENCOUNTER — Other Ambulatory Visit: Payer: Self-pay | Admitting: Family Medicine

## 2021-01-29 DIAGNOSIS — F418 Other specified anxiety disorders: Secondary | ICD-10-CM

## 2021-04-06 ENCOUNTER — Other Ambulatory Visit: Payer: Self-pay | Admitting: Family Medicine

## 2021-04-06 DIAGNOSIS — I10 Essential (primary) hypertension: Secondary | ICD-10-CM

## 2021-04-29 ENCOUNTER — Other Ambulatory Visit: Payer: Self-pay | Admitting: Family Medicine

## 2021-04-29 DIAGNOSIS — F418 Other specified anxiety disorders: Secondary | ICD-10-CM

## 2021-06-10 ENCOUNTER — Other Ambulatory Visit: Payer: Self-pay | Admitting: Family Medicine

## 2021-06-10 DIAGNOSIS — I10 Essential (primary) hypertension: Secondary | ICD-10-CM

## 2021-07-03 ENCOUNTER — Other Ambulatory Visit: Payer: Self-pay | Admitting: Family Medicine

## 2021-07-03 DIAGNOSIS — K219 Gastro-esophageal reflux disease without esophagitis: Secondary | ICD-10-CM

## 2021-07-27 ENCOUNTER — Encounter: Payer: Self-pay | Admitting: *Deleted

## 2021-07-30 ENCOUNTER — Other Ambulatory Visit: Payer: Self-pay | Admitting: Family Medicine

## 2021-07-30 DIAGNOSIS — I10 Essential (primary) hypertension: Secondary | ICD-10-CM

## 2021-07-30 DIAGNOSIS — F418 Other specified anxiety disorders: Secondary | ICD-10-CM

## 2021-09-20 ENCOUNTER — Ambulatory Visit (INDEPENDENT_AMBULATORY_CARE_PROVIDER_SITE_OTHER): Payer: Self-pay | Admitting: Family Medicine

## 2021-09-20 DIAGNOSIS — Z538 Procedure and treatment not carried out for other reasons: Secondary | ICD-10-CM

## 2021-09-23 NOTE — Progress Notes (Signed)
Appointment scheduled in error and canceled.

## 2021-09-24 ENCOUNTER — Other Ambulatory Visit: Payer: Self-pay | Admitting: Family Medicine

## 2021-09-24 DIAGNOSIS — I10 Essential (primary) hypertension: Secondary | ICD-10-CM

## 2021-10-08 ENCOUNTER — Other Ambulatory Visit: Payer: Self-pay | Admitting: Family Medicine

## 2021-10-08 DIAGNOSIS — K219 Gastro-esophageal reflux disease without esophagitis: Secondary | ICD-10-CM

## 2021-10-08 DIAGNOSIS — E7849 Other hyperlipidemia: Secondary | ICD-10-CM

## 2021-11-06 ENCOUNTER — Other Ambulatory Visit: Payer: Self-pay | Admitting: Family Medicine

## 2021-11-06 DIAGNOSIS — I10 Essential (primary) hypertension: Secondary | ICD-10-CM

## 2021-11-06 DIAGNOSIS — F418 Other specified anxiety disorders: Secondary | ICD-10-CM

## 2022-01-08 ENCOUNTER — Other Ambulatory Visit: Payer: Self-pay | Admitting: Family Medicine

## 2022-01-08 DIAGNOSIS — E7849 Other hyperlipidemia: Secondary | ICD-10-CM

## 2022-01-08 DIAGNOSIS — K219 Gastro-esophageal reflux disease without esophagitis: Secondary | ICD-10-CM

## 2022-06-24 ENCOUNTER — Other Ambulatory Visit: Payer: Self-pay

## 2022-06-24 DIAGNOSIS — K219 Gastro-esophageal reflux disease without esophagitis: Secondary | ICD-10-CM

## 2022-06-24 MED ORDER — OMEPRAZOLE 20 MG PO CPDR: 20.0000 mg | DELAYED_RELEASE_CAPSULE | Freq: Every day | ORAL | 3 refills | Status: DC

## 2022-08-23 ENCOUNTER — Telehealth: Payer: Self-pay | Admitting: Family Medicine

## 2022-08-23 DIAGNOSIS — I10 Essential (primary) hypertension: Secondary | ICD-10-CM

## 2022-08-23 NOTE — Telephone Encounter (Signed)
Patient walked in to request all prescriptions be sent to [facility] on [address] [place], [place]   [address]. Requesting Rx for Lisinopril be sent [facility] at [facility]  .  Pt. ph # [phone]

## 2022-08-24 ENCOUNTER — Telehealth: Payer: Self-pay

## 2022-08-24 NOTE — Telephone Encounter (Signed)
Chart review completed for patient. Patient is due for screening mammogram. Mychart message sent to patient to inquire about scheduling mammogram.  Shealee Yordy, Population Health Specialist.  

## 2022-08-26 MED ORDER — LISINOPRIL 40 MG PO TABS: 40.0000 mg | ORAL_TABLET | Freq: Every day | ORAL | 3 refills | Status: DC

## 2022-08-26 NOTE — Telephone Encounter (Signed)
I was not sure if she wanted ALL her rx filled on just lisinopril. I filled t he lisinopril. If she wants the others, please clarify as she is listed for metoprolol. Hydrodiuril, celexa. Atorvastatin and omeprazole. I do not mind filling them, just need to make sure she is taking them all or needs them all. THANKS! Denny Levy

## 2022-10-25 ENCOUNTER — Other Ambulatory Visit: Payer: Self-pay | Admitting: Family Medicine

## 2022-10-25 DIAGNOSIS — E7849 Other hyperlipidemia: Secondary | ICD-10-CM

## 2022-10-25 MED ORDER — ATORVASTATIN CALCIUM 40 MG PO TABS: 40.0000 mg | ORAL_TABLET | Freq: Every day | ORAL | 3 refills | Status: AC

## 2022-10-25 NOTE — Telephone Encounter (Signed)
Patient came in stating that she needs a refill on her Lipitor please. Wants to make sure it gets sent to [facility] on [address]

## 2023-01-30 ENCOUNTER — Ambulatory Visit (INDEPENDENT_AMBULATORY_CARE_PROVIDER_SITE_OTHER): Payer: Medicare Other | Admitting: Family Medicine

## 2023-01-30 ENCOUNTER — Encounter: Payer: Self-pay | Admitting: Family Medicine

## 2023-01-30 VITALS — BP 184/98 | HR 83 | Ht 62.0 in | Wt 116.8 lb

## 2023-01-30 DIAGNOSIS — Z1231 Encounter for screening mammogram for malignant neoplasm of breast: Secondary | ICD-10-CM

## 2023-01-30 DIAGNOSIS — I10 Essential (primary) hypertension: Secondary | ICD-10-CM | POA: Diagnosis not present

## 2023-01-30 DIAGNOSIS — E785 Hyperlipidemia, unspecified: Secondary | ICD-10-CM

## 2023-01-30 DIAGNOSIS — Z13820 Encounter for screening for osteoporosis: Secondary | ICD-10-CM

## 2023-01-30 DIAGNOSIS — K219 Gastro-esophageal reflux disease without esophagitis: Secondary | ICD-10-CM

## 2023-01-30 DIAGNOSIS — F418 Other specified anxiety disorders: Secondary | ICD-10-CM | POA: Diagnosis not present

## 2023-01-30 DIAGNOSIS — N1831 Chronic kidney disease, stage 3a: Secondary | ICD-10-CM

## 2023-01-30 MED ORDER — CITALOPRAM HYDROBROMIDE 20 MG PO TABS: 20.0000 mg | ORAL_TABLET | Freq: Every day | ORAL | 0 refills | Status: DC

## 2023-01-30 MED ORDER — HYDROCHLOROTHIAZIDE 25 MG PO TABS: 25.0000 mg | ORAL_TABLET | Freq: Every day | ORAL | 3 refills | Status: DC

## 2023-01-30 MED ORDER — METOPROLOL TARTRATE 50 MG PO TABS: 50.0000 mg | ORAL_TABLET | Freq: Two times a day (BID) | ORAL | 3 refills | Status: DC

## 2023-01-30 NOTE — Assessment & Plan Note (Signed)
Uncontrolled. Restart celexa. Anticipate dose increase at follow up if tolerating.

## 2023-01-30 NOTE — Progress Notes (Signed)
   SUBJECTIVE:   CHIEF COMPLAINT / HPI: needs med refills, just got Medicare.   Hypertension: - Medications: lisinopril, metoprolol, hydrochlorothiazide  - Compliance: has been out of metoprolol, hydrochlorothiazide for months. Took lisinopril this morning.  - Checking BP at home: yes, 180s at work - Denies any SOB, CP, LE edema, medication SEs, or symptoms of hypotension  Anxiety, Depression - Medications: celexa - Taking: 2 months has been out.  - Counseling: no     01/30/2023    9:35 AM  GAD 7 : Generalized Anxiety Score  Nervous, Anxious, on Edge 0  Control/stop worrying 1  Worry too much - different things 0  Trouble relaxing 1  Restless 1  Easily annoyed or irritable 1  Afraid - awful might happen 1  Total GAD 7 Score 5  Anxiety Difficulty Somewhat difficult       01/30/2023    9:13 AM 07/01/2020    4:11 PM 06/22/2020   10:58 AM  Depression screen PHQ 2/9  Decreased Interest 1 1 2   Down, Depressed, Hopeless 1 1 0  PHQ - 2 Score 2 2 2   Altered sleeping 2 1 1   Tired, decreased energy 2 1 1   Change in appetite 3 2 2   Feeling bad or failure about yourself  0 0 0  Trouble concentrating 0 0 0  Moving slowly or fidgety/restless 0 0 0  Suicidal thoughts 0 0 0  PHQ-9 Score 9 6 6   Difficult doing work/chores   Somewhat difficult    HLD - medications: lipitor - compliance: good - medication SEs: none  GERD - omeprazole. Good compliance. Helps but still with heartburn.   HM - due for pap, colonoscopy, mammogram, DEXA, PCV20, flu, shingles vaccines  OBJECTIVE:   BP (!) 184/98   Pulse 83   Ht 5\' 2"  (1.575 m)   Wt 116 lb 12.8 oz (53 kg)   SpO2 100%   BMI 21.36 kg/m   Gen: well appearing, in NAD Card: RRR Lungs: CTAB Ext: WWP, no edema   ASSESSMENT/PLAN:   Hypertension Uncontrolled. With intermittent headaches. Refilled meds and checking labs today. F/u next week with home log.   GERD (gastroesophageal reflux disease) Increase omeprazole to  BID.  CKD (chronic kidney disease), stage III (HCC) Recheck labs. Control BP as above.   Hyperlipidemia Continue statin.  Anxious depression Uncontrolled. Restart celexa. Anticipate dose increase at follow up if tolerating.   HM - DEXA and mammogram ordered today. Offer flu and PCV vaccines and colonoscopy referral at follow up. Needs appt for pap smear.  F/u next week for BP recheck, health maintenance.  Caro Laroche, DO

## 2023-01-30 NOTE — Patient Instructions (Addendum)
It was great to see you!  Our plans for today:  - We refilled your medications.  - Monitor your blood pressure at home and keep a log of your readings. Make sure to be seated for at least 5 minutes prior to testing and not in pain or worked up for the most accurate readings. Bring this log with you to follow up.  - Come back next week to recheck your blood pressure.  We are checking some labs today, we will release these results to your MyChart.  We ordered a screening mammogram and bone density scan for you today. You can call [facility] at [phone] to schedule this.   Take care and seek immediate care sooner if you develop any concerns.   Dr. Linwood Dibbles

## 2023-01-30 NOTE — Assessment & Plan Note (Addendum)
Recheck labs. Control BP as above.

## 2023-01-30 NOTE — Assessment & Plan Note (Signed)
Increase omeprazole to BID

## 2023-01-30 NOTE — Assessment & Plan Note (Signed)
Uncontrolled. With intermittent headaches. Refilled meds and checking labs today. F/u next week with home log.

## 2023-01-30 NOTE — Assessment & Plan Note (Signed)
Continue statin. 

## 2023-01-31 ENCOUNTER — Telehealth: Payer: Self-pay

## 2023-01-31 LAB — BASIC METABOLIC PANEL
BUN/Creatinine Ratio: 22 (ref 12–28)
BUN: 23 mg/dL (ref 8–27)
CO2: 21 mmol/L (ref 20–29)
Calcium: 9.2 mg/dL (ref 8.7–10.3)
Chloride: 107 mmol/L — ABNORMAL HIGH (ref 96–106)
Creatinine, Ser: 1.05 mg/dL — ABNORMAL HIGH (ref 0.57–1.00)
Glucose: 106 mg/dL — ABNORMAL HIGH (ref 70–99)
Potassium: 4.2 mmol/L (ref 3.5–5.2)
Sodium: 144 mmol/L (ref 134–144)
eGFR: 59 mL/min/{1.73_m2} — ABNORMAL LOW (ref >59)

## 2023-01-31 NOTE — Telephone Encounter (Signed)
Weston Brass from [employer] calls nurse line in regards to patient.   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  He reports she has been complaining of blurry vision. He reports she denies any chest pains, headaches, dizziness or shortness of breath.   He reports she was seen yesterday, however no medication changes were made. He reports she took all (3) blood pressure medications today.   Patient advised to go to ED or UC for blood pressure and blurry vision. He reports she "really does not want to do that."  Patient scheduled for tomorrow in [facility] in the morning for evaluation.   Stressed the importance of evaluation today.

## 2023-02-01 ENCOUNTER — Ambulatory Visit (INDEPENDENT_AMBULATORY_CARE_PROVIDER_SITE_OTHER): Payer: Medicare Other | Admitting: Family Medicine

## 2023-02-01 ENCOUNTER — Ambulatory Visit (HOSPITAL_COMMUNITY)
Admission: RE | Admit: 2023-02-01 | Discharge: 2023-02-01 | Disposition: A | Discharge status: Home / Self Care | Payer: Medicare Other | Source: Ambulatory Visit | Attending: Family Medicine | Admitting: Family Medicine

## 2023-02-01 VITALS — BP 180/60 | HR 58 | Ht 62.0 in | Wt 115.0 lb

## 2023-02-01 DIAGNOSIS — H538 Other visual disturbances: Secondary | ICD-10-CM | POA: Diagnosis present

## 2023-02-01 DIAGNOSIS — R29818 Other symptoms and signs involving the nervous system: Secondary | ICD-10-CM | POA: Diagnosis present

## 2023-02-01 DIAGNOSIS — I10 Essential (primary) hypertension: Secondary | ICD-10-CM

## 2023-02-01 MED ORDER — AMLODIPINE BESYLATE 5 MG PO TABS: 5.0000 mg | ORAL_TABLET | Freq: Every day | ORAL | 0 refills | Status: DC

## 2023-02-01 NOTE — Patient Instructions (Signed)
It was wonderful to see you today.  Please bring ALL of your medications with you to every visit.   Today we talked about:  High blood pressure and symptoms of blurry vision and headache - I am worried about your high blood pressure. I think it could be causing some neurologic changes to cause weakness, blurry vision and headache. I am glad the pressure has improved, but given your symptoms and that it is still high I am going to add another medication called amlodipine.   Tomorrow you have an appointment with Dr. Raymondo Band (the pharmacist) to get a 24 hour blood pressure monitor placed.   If you have any worsening headache, vision loss, or dizziness, please immediately call 911 and go to the Emergency department    Thank you for choosing [facility].   Please call [phone] with any questions about today's appointment.  Lockie Mola, MD  Family Medicine

## 2023-02-01 NOTE — Progress Notes (Signed)
    SUBJECTIVE:   CHIEF COMPLAINT / HPI:   Hypertension  Patient is following up for uncontrolled hypertension with symptoms. She had been out of her metoprolol and hydrochlorothiazide (was taking lisinopril) and noticed that she did not feel well at work about a week ago. Her boss (works at [employer]) took her BP and it was over 180/90. Patient had headache and blurry vision. Boss gave her an emergency pill of metoprolol. She came to clinic on 12/10 and was refilled on her metoprolol and hydrochlorothiazide. She had been on lisinopril and metoprolol as prescribed for about a week at that time. That night she had very intense headache as well as complete darkening of her vision followed by blurry vision and feeling dizzy. The sensations improved when she woke up. Her SBP that night was 210. Patient has been taking all three medications as prescribed for the last two days and dizziness has resolved. Now she just has mild headache However, she continues to notice blurry vision on the right side. She feels generally unwell.   PERTINENT  PMH / PSH: HTN, GAD, HLD  OBJECTIVE:   BP (!) 180/60   Pulse (!) 58   Ht 5\' 2"  (1.575 m)   Wt 115 lb (52.2 kg)   SpO2 100%   BMI 21.03 kg/m   General:  in no acute distress CV: RRR, radial pulses equal and palpable, no BLE edema  Resp: Normal work of breathing on room air, CTAB Abd: Soft, non tender, non distended  Neuro: Alert & Oriented x 4, EOMI, PERRLA, fundoscopic exam unequivocal, visual fields normal, slight weakness of SCM on right side, sensation on face normal, CN 12 normal.  Upper and lower extremity strength and sensation normal. No truncal ataxia, normal gait.     ASSESSMENT/PLAN:   Assessment & Plan Primary hypertension Uncontrolled and concerning given continued symptoms of headache and blurry vision with elevated pressures. No other end organ damage based on labs from last visit.  - Ambulatory 24 hr blood pressure monitor with Dr. Raymondo Band  to place tomorrow  - Add amlodipine 5 mg daily  - Continue lisinopril, metoprolol, and hydrochlorothiazide  - Follow up in one week  Blurry vision, bilateral Visual field testing in clinic did not notice an acute localized abnormality. Could be that patient has presbyopia that she is noticing more now. No other evidence of increased ICP  causing visual defects.  - Refer to ophthalmologist  - Gave return precautions and ED precautions for worsening vision or black spots, darkening vision  Neurological deficit present Right SCM weakness that patient does not remember when was last normal is concerning given all of the other recent symptoms.  Shared decision making with patient and she did not want to go for stat evaluation in the ED for hypertensive emergency or stroke.  No progression or worsening of symptoms is reassuring.  - Stat CT Head wo contrast  - Control blood pressure with regimen as above  - ED precautions discussed with patient for further weakness of neurologic deficit.  - Follow up in one week       Lockie Mola, MD [facility]

## 2023-02-02 ENCOUNTER — Encounter: Payer: Self-pay | Admitting: Pharmacist

## 2023-02-02 ENCOUNTER — Ambulatory Visit (INDEPENDENT_AMBULATORY_CARE_PROVIDER_SITE_OTHER): Payer: Medicare Other | Admitting: Pharmacist

## 2023-02-02 VITALS — BP 176/94 | Wt 115.0 lb

## 2023-02-02 DIAGNOSIS — I10 Essential (primary) hypertension: Secondary | ICD-10-CM

## 2023-02-02 NOTE — Assessment & Plan Note (Signed)
History of hypertension longstanding currently taking four drug regimen (just restarted previously effective amlodipine) with goal presssure of <140 mm Hg systolic and possibly lower if tolerated.  -Placed blood pressure cuff, provided education, patient instructed to wear cuff for 24 hours and return tomorrow to review results.

## 2023-02-02 NOTE — Patient Instructions (Signed)
Blood Pressure Activity Diary Time Lying down/ Sleeping Walking/ Exercise Stressed/ Angry Headache/ Pain Dizzy  9 AM       10 AM       11 AM       12 PM       1 PM       2 PM       Time Lying down/ Sleeping Walking/ Exercise Stressed/ Angry Headache/ Pain Dizzy  3 PM       4 PM        5 PM       6 PM       7 PM       8 PM       Time Lying down/ Sleeping Walking/ Exercise Stressed/ Angry Headache/ Pain Dizzy  9 PM       10 PM       11 PM       12 AM       1 AM       2 AM       3 AM       Time Lying down/ Sleeping Walking/ Exercise Stressed/ Angry Headache/ Pain Dizzy  4 AM       5 AM       6 AM       7 AM       8 AM       9 AM       10 AM        Time you woke up: _________                  Time you went to sleep:__________  Come back tomorrow at 8:30 to have the monitor removed  Call the [facility] if you have any questions before then ([phone])  Wearing the Blood Pressure Monitor The cuff will inflate every 20 minutes during the day and every 30 minutes while you sleep. Fill out the blood pressure-activity diary during the day, especially during activities that may affect your reading -- such as exercise, stress, walking, taking your blood pressure medications  Important things to know: Avoid taking the monitor off for the next 24 hours, unless it causes you discomfort or pain. Do NOT get the monitor wet and do NOT try to clean the monitor with any cleaning products. Do NOT put the monitor on anyone else's arm. When the cuff inflates, avoid excess movement. Let the cuffed arm hang loosely, slightly away from the body. Avoid flexing the muscles or moving the hand/fingers. Remember to fill out the blood pressure activity diary. If you experience severe pain or unusual pain (not associated with getting your blood pressure checked), remove the monitor.  Troubleshooting:  Code  Troubleshooting   1  Check cuff position, tighten cuff   2, 3  Remain still  during reading   4, 87  Check air hose connections and make sure cuff is tight   85, 89  Check hose connections and make tubing is not crimped   86  Push START/STOP to restart reading   88, 91  Retry by pushing START/STOP   90  Replace batteries. If problem persists, remove monitor and bring back to   clinic at follow up   97, 98, 99  Service required - Remove monitor and bring back to clinic at follow up

## 2023-02-02 NOTE — Progress Notes (Signed)
Reviewed and agree with Dr Koval's plan.   

## 2023-02-02 NOTE — Progress Notes (Signed)
   S:     Chief Complaint  Patient presents with   Medication Management    Ambulatory BP monitor   67 y.o. female who presents for hypertension evaluation, education, and management. Patient arrives in good spirits and presents without any assistance.   PMH is significant for longstanding hypertension.  Patient was referred and last seen by Primary Care Provider, Dr. Marsh Dolly, on 02/01/2023.   At last visit, patient was restarted on previously prescribed and utilized amlodipine 5mg  daily.   Diagnosed with Hypertension many years ago - "more than 10".    Medication compliance is reported to be good however a pharmacy process error prevented her from obtaining amlodipine over the last two month.  She is happy to have restarted this medicine.  Discussed procedure for wearing the monitor and gave patient written instructions. Monitor was placed on non-dominant arm with instructions to return in the morning.   Current BP Medications include:  amlodipine 5mg , hydrochlorothiazide 25mg , lisinopril 40mg  and metoprolol 50mg  BID   O:  Review of Systems  Constitutional:  Positive for diaphoresis and fever.  All other systems reviewed and are negative. Reports nightly episodes of feeling hot and complains this is interfering with sleep.   Physical Exam Constitutional:      Appearance: She is normal weight.  Pulmonary:     Effort: Pulmonary effort is normal.  Neurological:     Mental Status: She is alert.  Psychiatric:        Mood and Affect: Mood normal.        Thought Content: Thought content normal.        Judgment: Judgment normal.     Last 3 Office BP readings: BP Readings from Last 3 Encounters:  02/01/23 (!) 180/60  01/30/23 (!) 184/98  07/01/20 104/62    Clinical Atherosclerotic Cardiovascular Disease (ASCVD):  The ASCVD Risk score (Arnett DK, et al., 2019) failed to calculate for the following reasons:   Cannot find a previous HDL lab   Cannot find a previous total  cholesterol lab  Basic Metabolic Panel    Component Value Date/Time   NA 144 01/30/2023 1021   K 4.2 01/30/2023 1021   CL 107 (H) 01/30/2023 1021   CO2 21 01/30/2023 1021   GLUCOSE 106 (H) 01/30/2023 1021   GLUCOSE 97 10/03/2019 1540   BUN 23 01/30/2023 1021   CREATININE 1.05 (H) 01/30/2023 1021   CREATININE 0.75 03/14/2016 1633   CALCIUM 9.2 01/30/2023 1021   GFRNONAA 42 (L) 10/03/2019 1540   GFRNONAA 88 03/14/2016 1633   GFRAA 48 (L) 10/03/2019 1540   GFRAA >89 03/14/2016 1633    Renal function: Estimated Creatinine Clearance: 42.2 mL/min (A) (by C-G formula based on SCr of 1.05 mg/dL (H)).   ABPM Study Data: Arm Placement left arm  For Office Goal BP of <140 mmHg Systolic - lower if tolerated:  ABPM thresholds: Overall Systolic BP <130 mmHg, daytime BP <135 mmHg, sleeptime BP <120 mmHg    A/P: History of hypertension longstanding currently taking four drug regimen (just restarted previously effective amlodipine) with goal presssure of <140 mm Hg systolic and possibly lower if tolerated.  -Placed blood pressure cuff, provided education, patient instructed to wear cuff for 24 hours and return tomorrow to review results.   Written patient instructions provided including activity/symptom/event log. Patient verbalized understanding of plan. Total time in face to face counseling 22 minutes.    Follow-up: Tomorrow AM - early morning appointment 8:30 AM

## 2023-02-02 NOTE — Assessment & Plan Note (Signed)
Uncontrolled and concerning given continued symptoms of headache and blurry vision with elevated pressures. No other end organ damage based on labs from last visit.  - Ambulatory 24 hr blood pressure monitor with Dr. Raymondo Band to place tomorrow  - Add amlodipine 5 mg daily  - Continue lisinopril, metoprolol, and hydrochlorothiazide  - Follow up in one week

## 2023-02-03 ENCOUNTER — Ambulatory Visit: Payer: Medicare Other | Admitting: Pharmacist

## 2023-02-03 ENCOUNTER — Telehealth: Payer: Self-pay

## 2023-02-03 ENCOUNTER — Encounter: Payer: Self-pay | Admitting: Pharmacist

## 2023-02-03 VITALS — BP 147/75 | HR 56

## 2023-02-03 DIAGNOSIS — I10 Essential (primary) hypertension: Secondary | ICD-10-CM | POA: Diagnosis not present

## 2023-02-03 NOTE — Progress Notes (Signed)
S:     Chief Complaint  Patient presents with   Medication Management    Amb BP Monitor - Day #2   67 y.o. female who presents for hypertension evaluation, education, and management.  Patient arrives in good spirits and presents without any assistance.   PMH is significant for longstanding hypertension.  Patient returns to clinic with 24 hour blood pressure monitor and reports no issues with use. Patient reports they were able to wear the Ambulatory Blood Pressure Cuff for the entire 24 evaluation period.   O:  Review of Systems  All other systems reviewed and are negative.   Physical Exam Vitals reviewed.  Constitutional:      Appearance: Normal appearance.  Pulmonary:     Effort: Pulmonary effort is normal.  Neurological:     Mental Status: She is alert.  Psychiatric:        Mood and Affect: Mood normal.        Thought Content: Thought content normal.    Last 3 Office BP readings: BP Readings from Last 3 Encounters:  02/02/23 (!) 176/94  02/01/23 (!) 180/60  01/30/23 (!) 184/98    Basic Metabolic Panel    Component Value Date/Time   NA 144 01/30/2023 1021   K 4.2 01/30/2023 1021   CL 107 (H) 01/30/2023 1021   CO2 21 01/30/2023 1021   GLUCOSE 106 (H) 01/30/2023 1021   GLUCOSE 97 10/03/2019 1540   BUN 23 01/30/2023 1021   CREATININE 1.05 (H) 01/30/2023 1021   CREATININE 0.75 03/14/2016 1633   CALCIUM 9.2 01/30/2023 1021   GFRNONAA 42 (L) 10/03/2019 1540   GFRNONAA 88 03/14/2016 1633   GFRAA 48 (L) 10/03/2019 1540   GFRAA >89 03/14/2016 1633    ABPM Study Data: Arm Placement left arm  Overall Mean 24hr BP:   141/72 mmHg  HR: 55  Daytime Mean BP:  147/75 mmHg  HR: 56  Nighttime Mean BP:  121/60 mmHg  HR: 52  Dipping Pattern: Yes.    Sys:   17%   Dia: 19%   [normal dipping ~10-20%]   For Office Goal BP of <140 mmHg Systolic - lower if tolerated:  ABPM thresholds: Overall Systolic BP <130 mmHg, daytime BP <135 mmHg, sleeptime BP <120 mmHg     A/P: History of hypertension longstanding for many years currently taking 4 drug regimen.  Goal presssure of <140 mmHg systolic and lower if tolerated.  Recently reinitiated on amlodipine 5mg  after being off for several months due to a pharmacy supply issue. Found to have isolated systolic hypertension with 24-hour ambulatory blood pressure evaluation which demonstrates an average AWAKE blood pressure of 147/75 mmHg.  Nocturnal dipping pattern is normal.   Changes to medications -Continued amlodipine 5mg  daily, lisinopril 40mg  daily, hydrochlorothiazide 25mg  daily and metoprolol 50mg  BID - Following discussion of goals including balancing the risk of dizziness and falls from escalating treatment, we agreed that more home readings over the next week may be helpful to determine if dose adjustment is necessary.   - Patient agreed to bring readings from the next week to her appointment with Dr. Linwood Dibbles in 1 week.  *Patient plans to bring home monitor to that visit as well to compare readings with our office device.   Results reviewed and written information provided.    Written patient instructions provided. Patient verbalized understanding of treatment plan.  Total time in face to face counseling 21 minutes.    Follow-up:  Pharmacist PRN PCP clinic visit,  Dr. Linwood Dibbles in 1 week 12/20

## 2023-02-03 NOTE — Patient Instructions (Signed)
It was nice to see you today!  Thank you for completing the blood pressure monitoring evaluation.  Your goal blood pressure is < 140 mmHg systolic (top number)  Medication Changes: Continue all other medication the same.   Monitor blood pressure at home and keep a log (on a piece of paper) to bring with you to your next visit.

## 2023-02-03 NOTE — Assessment & Plan Note (Signed)
History of hypertension longstanding for many years currently taking 4 drug regimen.  Goal presssure of <140 mmHg systolic and lower if tolerated.  Recently reinitiated on amlodipine 5mg  after being off for several months due to a pharmacy supply issue. Found to have isolated systolic hypertension with 24-hour ambulatory blood pressure evaluation which demonstrates an average AWAKE blood pressure of 147/75 mmHg.  Nocturnal dipping pattern is normal.   Changes to medications -Continued amlodipine 5mg  daily, lisinopril 40mg  daily, hydrochlorothiazide 25mg  daily and metoprolol 50mg  BID - Following discussion of goals including balancing the risk of dizziness and falls from escalating treatment, we agreed that more home readings over the next week may be helpful to determine if dose adjustment is necessary.   - Patient agreed to bring readings from the next week to her appointment with Dr. Linwood Dibbles in 1 week.  *Patient plans to bring home monitor to that visit as well to compare readings with our office device.

## 2023-02-03 NOTE — Telephone Encounter (Signed)
Patient calls nurse line stating that she just missed a call from our office.   Per chart review, I am unable to find where our office had attempted to call her.   Will forward to Dr. Raymondo Band, as patient was concerned that he was trying to reach her.   Patient also has upcoming appointment with Dr. Linwood Dibbles on 02/10/23. Advised patient of upcoming appointment date and time.   Sarah Prude, RN

## 2023-02-07 NOTE — Progress Notes (Signed)
Reviewed and agree with Dr Koval's plan.   

## 2023-02-10 ENCOUNTER — Encounter: Payer: Self-pay | Admitting: Family Medicine

## 2023-02-10 ENCOUNTER — Ambulatory Visit (INDEPENDENT_AMBULATORY_CARE_PROVIDER_SITE_OTHER): Payer: Medicare Other | Admitting: Family Medicine

## 2023-02-10 VITALS — BP 138/70 | HR 60 | Ht 62.0 in | Wt 113.8 lb

## 2023-02-10 DIAGNOSIS — Z23 Encounter for immunization: Secondary | ICD-10-CM | POA: Diagnosis not present

## 2023-02-10 DIAGNOSIS — F418 Other specified anxiety disorders: Secondary | ICD-10-CM | POA: Diagnosis not present

## 2023-02-10 DIAGNOSIS — K219 Gastro-esophageal reflux disease without esophagitis: Secondary | ICD-10-CM | POA: Diagnosis present

## 2023-02-10 DIAGNOSIS — I10 Essential (primary) hypertension: Secondary | ICD-10-CM | POA: Diagnosis not present

## 2023-02-10 MED ORDER — CITALOPRAM HYDROBROMIDE 40 MG PO TABS: 40.0000 mg | ORAL_TABLET | Freq: Every day | ORAL | 0 refills | Status: DC

## 2023-02-10 MED ORDER — OMEPRAZOLE 40 MG PO CPDR: 40.0000 mg | DELAYED_RELEASE_CAPSULE | Freq: Every day | ORAL | Status: DC

## 2023-02-10 NOTE — Assessment & Plan Note (Signed)
Now at goal, no changes.

## 2023-02-10 NOTE — Progress Notes (Signed)
    SUBJECTIVE:   CHIEF COMPLAINT / HPI:   Hypertension: - Medications: hydrochlorothiazide, lisinopril, amlodipine, metoprolol - Compliance: good - Checking BP at home: yes, 120-150s SBP, 60-70s DBP - saw Dr. Raymondo Band for amb BP monitoring. Average awake BP 147/75 with normal nocturnal dipping. No med changes made at that time.   Depression - restarted citalopram at 20mg  at last visit. - Still feeling down with stressors of taking care of mom.      02/10/2023    9:08 AM 02/01/2023   12:03 PM 01/30/2023    9:13 AM  Depression screen PHQ 2/9  Decreased Interest 2 1 1   Down, Depressed, Hopeless 1 1 1   PHQ - 2 Score 3 2 2   Altered sleeping 3 3 2   Tired, decreased energy 3 2 2   Change in appetite 3 3 3   Feeling bad or failure about yourself  0 0 0  Trouble concentrating 1 0 0  Moving slowly or fidgety/restless 1 0 0  Suicidal thoughts 0 0 0  PHQ-9 Score 14 10 9   Difficult doing work/chores  Somewhat difficult       01/30/2023    9:35 AM  GAD 7 : Generalized Anxiety Score  Nervous, Anxious, on Edge 0  Control/stop worrying 1  Worry too much - different things 0  Trouble relaxing 1  Restless 1  Easily annoyed or irritable 1  Afraid - awful might happen 1  Total GAD 7 Score 5  Anxiety Difficulty Somewhat difficult    Health Maintenance - due for mammogram, DEXA, CRC screening, PCV20, shingles vaccine, pap smear. No FH of CRC.   OBJECTIVE:   BP 138/70   Pulse 60   Ht 5\' 2"  (1.575 m)   Wt 113 lb 12.8 oz (51.6 kg)   SpO2 100%   BMI 20.81 kg/m   Gen: well appearing, in NAD Card: Reg rate Lungs: Comfortable WOB on RA Ext: WWP, no edema   ASSESSMENT/PLAN:   Hypertension Now at goal, no changes.   Anxious depression Remains uncontrolled. Discussed increasing citalopram vs trialing different SSRI, elected to increase to 40mg . Last Qtc normal. F/u 1 month.   Health maintenance - discussed. Received PCV20 today. Wants to wait on CRC screening, would be  candidate for Cologuard or FIT testing. Mammogram and DEXA previously ordered, gave number to call and schedule.   Caro Laroche, DO

## 2023-02-10 NOTE — Patient Instructions (Signed)
It was great to see you!  Our plans for today:  - No changes to your blood pressure medications.  - We increased your citalopram to 40mg . Come back in 1-2 months for follow up.  - We ordered a screening mammogram and bone density scan for you. You can call [facility] at [phone] to schedule this.  - I recommend getting colon cancer screening. Today we talked about the different options for screening. Let us know if you would like to proceed with this.   Take care and seek immediate care sooner if you develop any concerns.   Dr. Linwood Dibbles

## 2023-02-10 NOTE — Assessment & Plan Note (Signed)
Remains uncontrolled. Discussed increasing citalopram vs trialing different SSRI, elected to increase to 40mg . Last Qtc normal. F/u 1 month.

## 2023-02-21 ENCOUNTER — Other Ambulatory Visit (HOSPITAL_COMMUNITY): Payer: Self-pay

## 2023-03-08 ENCOUNTER — Ambulatory Visit: Payer: Medicare Other | Admitting: Family Medicine

## 2023-03-08 VITALS — BP 164/81 | HR 63 | Wt 114.6 lb

## 2023-03-08 DIAGNOSIS — I10 Essential (primary) hypertension: Secondary | ICD-10-CM

## 2023-03-08 NOTE — Patient Instructions (Signed)
 Please make an appointment for March to see me.

## 2023-03-09 NOTE — Progress Notes (Signed)
    CHIEF COMPLAINT / HPI:  F/u HTN She is taking her medicines regularly.  Not having any headache, no shortness of breath, no lower extremity edema.  Has no problem obtaining her medicines.  She does get nervous when she comes to the doctor.   PERTINENT  PMH / PSH: I have reviewed the patient's medications, allergies, past medical and surgical history, smoking status and updated in the EMR as appropriate.   OBJECTIVE:  BP (!) 164/81   Pulse 63   Wt 114 lb 9.6 oz (52 kg)   SpO2 100%   BMI 20.96 kg/m  Vital signs reviewed. GENERAL: Well-developed, well-nourished, no acute distress. CARDIOVASCULAR: Regular rate and rhythm no murmur gallop or rub LUNGS: Clear to auscultation bilaterally, no rales or wheeze. ABDOMEN: Soft positive bowel sounds NEURO: No gross focal neurological deficits. MSK: Movement of extremity x 4.   ASSESSMENT / PLAN: At next visit revisit health maintenance  Hypertension She seems to be pretty well-controlled on this 4 drug regimen.  Will continue but I will have her follow-up in 6 to 8 weeks.  Right now she does not have insurance and we need to get her health maintenance up-to-date.  Says she will likely have insurance within the next month.   Denny Levy MD

## 2023-03-09 NOTE — Assessment & Plan Note (Signed)
She seems to be pretty well-controlled on this 4 drug regimen.  Will continue but I will have her follow-up in 6 to 8 weeks.  Right now she does not have insurance and we need to get her health maintenance up-to-date.  Says she will likely have insurance within the next month.

## 2023-03-13 ENCOUNTER — Other Ambulatory Visit: Payer: Self-pay | Admitting: Family Medicine

## 2023-03-13 ENCOUNTER — Ambulatory Visit (INDEPENDENT_AMBULATORY_CARE_PROVIDER_SITE_OTHER): Payer: Medicare Other | Admitting: Family Medicine

## 2023-03-13 ENCOUNTER — Encounter: Payer: Self-pay | Admitting: Family Medicine

## 2023-03-13 VITALS — BP 142/70 | HR 71 | Temp 97.8°F | Ht 62.0 in | Wt 112.6 lb

## 2023-03-13 DIAGNOSIS — M109 Gout, unspecified: Secondary | ICD-10-CM | POA: Insufficient documentation

## 2023-03-13 DIAGNOSIS — F418 Other specified anxiety disorders: Secondary | ICD-10-CM

## 2023-03-13 MED ORDER — PREDNISONE 20 MG PO TABS: 40.0000 mg | ORAL_TABLET | Freq: Every day | ORAL | 0 refills | Status: DC

## 2023-03-13 NOTE — Progress Notes (Signed)
   SUBJECTIVE:   CHIEF COMPLAINT / HPI:   L 4th Toe pain - since 1/13. Saw PCP 1/15 with no swelling at that time. Had subsequent swelling and redness worsened pain that afternoon. Worse with working and standing all day. Has had to miss work due to the pain. Is a Associate Professor at [employer]. Pain is sharp and radiating up foot. Better with lying down. No trauma. Tylenol not helping. Took one pill of brother's colchicine.  OBJECTIVE:   BP (!) 142/70   Pulse 71   Temp 97.8 F (36.6 C)   Ht 5\' 2"  (1.575 m)   Wt 112 lb 9.6 oz (51.1 kg)   BMI 20.59 kg/m   Gen: well appearing, in NAD Foot: b/l DP, TP pulses intact. L 4th MTP joint swollen, TTP and articulation with overlying erythema. No swelling, erythema, pain of neighboring joints. L foot exam normal.    ASSESSMENT/PLAN:   Gout Most likely first episdoe of acute gout. Less likely fracture as no trauma. No signs of infection. Unclear trigger. H/o CKD though last Cr normalized. Rx prednisone out of caution for renal function. RTC if no better after treatment.      Caro Laroche, DO

## 2023-03-13 NOTE — Patient Instructions (Signed)
It was great to see you!  Our plans for today:  - Take the prednisone for 4 days. If you are still having trouble after completing, come back to see [place].    Take care and seek immediate care sooner if you develop any concerns.   Dr. Linwood Dibbles

## 2023-03-13 NOTE — Assessment & Plan Note (Addendum)
Most likely first episdoe of acute gout. Less likely fracture as no trauma. No signs of infection. Unclear trigger. H/o CKD though last Cr normalized. Rx prednisone out of caution for renal function. RTC if no better after treatment.

## 2023-03-14 ENCOUNTER — Other Ambulatory Visit (HOSPITAL_COMMUNITY): Payer: Self-pay

## 2023-03-23 ENCOUNTER — Other Ambulatory Visit: Payer: Self-pay | Admitting: Family Medicine

## 2023-03-23 DIAGNOSIS — I10 Essential (primary) hypertension: Secondary | ICD-10-CM

## 2023-04-26 ENCOUNTER — Encounter: Payer: Self-pay | Admitting: Family Medicine

## 2023-04-26 ENCOUNTER — Other Ambulatory Visit: Payer: Self-pay | Admitting: Family Medicine

## 2023-04-26 ENCOUNTER — Ambulatory Visit (INDEPENDENT_AMBULATORY_CARE_PROVIDER_SITE_OTHER): Payer: Medicare Other | Admitting: Family Medicine

## 2023-04-26 VITALS — BP 156/78 | HR 68 | Ht 62.0 in | Wt 114.6 lb

## 2023-04-26 DIAGNOSIS — F418 Other specified anxiety disorders: Secondary | ICD-10-CM

## 2023-04-26 DIAGNOSIS — I10 Essential (primary) hypertension: Secondary | ICD-10-CM

## 2023-04-26 MED ORDER — PREDNISONE 20 MG PO TABS: ORAL_TABLET | ORAL | 1 refills | Status: DC

## 2023-04-26 MED ORDER — DIAZEPAM 5 MG PO TABS: ORAL_TABLET | ORAL | 1 refills | Status: DC

## 2023-04-26 NOTE — Patient Instructions (Signed)
 I am adding a tablet of diazepam at bedtime. Let's see if tat improves your AM anxiety. If it is not helping in next 2 weeks or so, MyChart me a message and we may need to change a dose.  I will plan on adjusting your BP meds in April when you have insurance. I have also called in some prednisone in case you have another gout attack.  Let me see you in about 3-4 weeks.

## 2023-04-27 NOTE — Progress Notes (Signed)
    CHIEF COMPLAINT / HPI: Follow-up hypertension.  Been taking medicines regularly.  Did have 2 gout flares. 2.  Having some panic attacks in the evening and when she first wakes up in the morning.  The morning time is the worst.  Feels like the minute she wakes up the whole day is sitting on her shoulders.   PERTINENT  PMH / PSH: I have reviewed the patient's medications, allergies, past medical and surgical history, smoking status and updated in the EMR as appropriate.   OBJECTIVE:  BP (!) 156/78   Pulse 68   Ht 5\' 2"  (1.575 m)   Wt 114 lb 9.6 oz (52 kg)   SpO2 100%   BMI 20.96 kg/m  Vital signs reviewed. GENERAL: Well-developed, well-nourished, no acute distress. CARDIOVASCULAR: Regular rate and rhythm no murmur gallop or rub LUNGS: Clear to auscultation bilaterally, no rales or wheeze. ABDOMEN: Soft positive bowel sounds NEURO: No gross focal neurological deficits. MSK: Movement of extremity x 4.   ASSESSMENT / PLAN:   Anxious depression Continue SSRI.  Will add nightly benzodiazepine for now.  Follow-up 3 to 4 weeks.  Hypertension Still little less than optimal control.  She does not have insurance right now and I really want to switch her to a combo pill so we will see her next month and we will hopefully take care of that as she thinks she will have insurance.  I would also like to get rid of thiazide if we can because I think it is increasing her frequency of gout attacks.  Since she is already had 2 attacks, I will give her some prednisone to use at the first sign of gout.   Denny Levy MD

## 2023-04-27 NOTE — Assessment & Plan Note (Signed)
 Continue SSRI.  Will add nightly benzodiazepine for now.  Follow-up 3 to 4 weeks.

## 2023-04-27 NOTE — Assessment & Plan Note (Signed)
 Still little less than optimal control.  She does not have insurance right now and I really want to switch her to a combo pill so we will see her next month and we will hopefully take care of that as she thinks she will have insurance.  I would also like to get rid of thiazide if we can because I think it is increasing her frequency of gout attacks.  Since she is already had 2 attacks, I will give her some prednisone to use at the first sign of gout.

## 2023-05-25 ENCOUNTER — Other Ambulatory Visit: Payer: Self-pay | Admitting: Family Medicine

## 2023-05-25 DIAGNOSIS — F418 Other specified anxiety disorders: Secondary | ICD-10-CM

## 2023-05-31 ENCOUNTER — Ambulatory Visit: Admitting: Family Medicine

## 2023-06-02 ENCOUNTER — Ambulatory Visit (INDEPENDENT_AMBULATORY_CARE_PROVIDER_SITE_OTHER): Admitting: Family Medicine

## 2023-06-02 ENCOUNTER — Encounter: Payer: Self-pay | Admitting: Family Medicine

## 2023-06-02 VITALS — BP 138/78 | HR 73 | Wt 110.8 lb

## 2023-06-02 DIAGNOSIS — M109 Gout, unspecified: Secondary | ICD-10-CM | POA: Diagnosis not present

## 2023-06-02 DIAGNOSIS — I10 Essential (primary) hypertension: Secondary | ICD-10-CM

## 2023-06-02 DIAGNOSIS — F418 Other specified anxiety disorders: Secondary | ICD-10-CM

## 2023-06-02 MED ORDER — PREDNISONE 20 MG PO TABS: ORAL_TABLET | ORAL | 1 refills | Status: DC

## 2023-06-02 MED ORDER — DIAZEPAM 10 MG PO TABS: ORAL_TABLET | ORAL | 1 refills | Status: DC

## 2023-06-02 NOTE — Patient Instructions (Signed)
 Let us increase the diazepam at bedtime to a 10 mg tablet. Let me see you in a month. I am calling in refills for the prednisone. I am also checking a uric acid level to see if we need to do something different about your gout.  Let me see you in a month!

## 2023-06-02 NOTE — Assessment & Plan Note (Signed)
 Much better blood pressure control.  No medication changes today.  Will try for combo pill and see her back if we still have good control.  Will also be able to look at her uric acid level at that time.

## 2023-06-02 NOTE — Assessment & Plan Note (Signed)
 Will increase diazepam to 10 mg nightly and follow-up 1 month.  Could consider adding or switching to Remeron.  She is currently on citalopram and we are continuing that.  Discussed sleep hygiene

## 2023-06-02 NOTE — Assessment & Plan Note (Signed)
 Did take 1 or 2 days of prednisone for impending gout.  Will check uric acid today.  We have discontinued her HCTZ.  Ideally would like to discontinue prednisone except for very intermittently

## 2023-06-02 NOTE — Progress Notes (Signed)
    CHIEF COMPLAINT / HPI: #1.  Follow-up hypertension: Took her medicine this morning.  About an hour ago.  No problems with it.  Taking it regularly has new insurance plan does not know the formulary for that. 2.  Early morning anxiety: We had started her on diazepam 5 mg at bedtime.  That is helping her sleep some but she still having some episodes of a.m. anxiety.  Probably improved by about 25 to 30%.  Sleep is much better however. 3.  Finances are keeping her from paying for her cholesterol medicine. #4.  Had to take a prednisone tablet once or twice during the last month because she felt like she was having impending gout.  Needs refill of that.  The prednisone did seem to truncate the gout attack.  Notably, we discontinued her HCTZ at last office visit.   PERTINENT  PMH / PSH: I have reviewed the patient's medications, allergies, past medical and surgical history, smoking status and updated in the EMR as appropriate.   OBJECTIVE:  BP (!) 148/86   Pulse 73   Wt 110 lb 12.8 oz (50.3 kg)   SpO2 98%   BMI 20.27 kg/m  GENERAL: Well-developed no acute distress  ASSESSMENT / PLAN:   Anxious depression Will increase diazepam to 10 mg nightly and follow-up 1 month.  Could consider adding or switching to Remeron.  She is currently on citalopram and we are continuing that.  Discussed sleep hygiene  Gout Did take 1 or 2 days of prednisone for impending gout.  Will check uric acid today.  We have discontinued her HCTZ.  Ideally would like to discontinue prednisone except for very intermittently  Hypertension Much better blood pressure control.  No medication changes today.  Will try for combo pill and see her back if we still have good control.  Will also be able to look at her uric acid level at that time.   Denny Levy MD

## 2023-06-03 ENCOUNTER — Encounter: Payer: Self-pay | Admitting: Family Medicine

## 2023-06-03 LAB — COMPREHENSIVE METABOLIC PANEL WITH GFR
ALT: 14 IU/L (ref 0–32)
AST: 26 IU/L (ref 0–40)
Albumin: 4.9 g/dL (ref 3.9–4.9)
Alkaline Phosphatase: 59 IU/L (ref 44–121)
BUN/Creatinine Ratio: 24 (ref 12–28)
BUN: 27 mg/dL (ref 8–27)
Bilirubin Total: 0.8 mg/dL (ref 0.0–1.2)
CO2: 24 mmol/L (ref 20–29)
Calcium: 9.9 mg/dL (ref 8.7–10.3)
Chloride: 101 mmol/L (ref 96–106)
Creatinine, Ser: 1.14 mg/dL — ABNORMAL HIGH (ref 0.57–1.00)
Globulin, Total: 2.5 g/dL (ref 1.5–4.5)
Glucose: 100 mg/dL — ABNORMAL HIGH (ref 70–99)
Potassium: 5.4 mmol/L — ABNORMAL HIGH (ref 3.5–5.2)
Sodium: 142 mmol/L (ref 134–144)
Total Protein: 7.4 g/dL (ref 6.0–8.5)
eGFR: 53 mL/min/{1.73_m2} — ABNORMAL LOW (ref >59)

## 2023-06-03 LAB — URIC ACID: Uric Acid: 6.9 mg/dL (ref 3.0–7.2)

## 2023-06-22 ENCOUNTER — Other Ambulatory Visit: Payer: Self-pay

## 2023-06-22 DIAGNOSIS — K219 Gastro-esophageal reflux disease without esophagitis: Secondary | ICD-10-CM

## 2023-06-22 MED ORDER — OMEPRAZOLE 40 MG PO CPDR: 40.0000 mg | DELAYED_RELEASE_CAPSULE | Freq: Every day | ORAL | 3 refills | Status: AC

## 2023-07-12 ENCOUNTER — Ambulatory Visit (INDEPENDENT_AMBULATORY_CARE_PROVIDER_SITE_OTHER): Admitting: Family Medicine

## 2023-07-12 VITALS — BP 138/80 | HR 60 | Ht 62.0 in | Wt 117.8 lb

## 2023-07-12 DIAGNOSIS — K219 Gastro-esophageal reflux disease without esophagitis: Secondary | ICD-10-CM | POA: Diagnosis not present

## 2023-07-12 DIAGNOSIS — I10 Essential (primary) hypertension: Secondary | ICD-10-CM

## 2023-07-12 DIAGNOSIS — F418 Other specified anxiety disorders: Secondary | ICD-10-CM

## 2023-07-12 MED ORDER — FAMOTIDINE 20 MG PO TABS: ORAL_TABLET | ORAL | 3 refills | Status: DC

## 2023-07-12 MED ORDER — DIAZEPAM 10 MG PO TABS: ORAL_TABLET | ORAL | 5 refills | Status: AC

## 2023-07-13 NOTE — Assessment & Plan Note (Signed)
 Anxiety and insomnia doing a little bit better.  Will continue current dosing of diazepam .  I have no concerns for abuse or misuse of this medication.  Will also continue SSRI.  Follow-up 3 months.

## 2023-07-13 NOTE — Assessment & Plan Note (Signed)
 Continue PPI at higher dose.  She was taking some old 20 mg omeprazole  for breakthrough and I would rather have her take famotidine  so we will give her a prescription of that.  Follow-up 3 months.

## 2023-07-13 NOTE — Progress Notes (Signed)
    CHIEF COMPLAINT / HPI: Here for follow-up hypertension.  Taking her medicine regularly.  No problems with the medication.  No shortness of breath. #2.  Follow-up insomnia.  Much better with the diazepam .  Can fall asleep and usually gets about 3 hours before she awakens but then it only takes her about half an hour to get back to sleep so she is getting 5 or 6 hours a night.  Says she is feels 100% better. #3.  Reflux: Better on the higher dose PPI but still having breakthrough.  Has breakthrough about 3 days a week   PERTINENT  PMH / PSH: I have reviewed the patient's medications, allergies, past medical and surgical history, smoking status and updated in the EMR as appropriate.   OBJECTIVE:  BP 138/80   Pulse 60   Ht 5\' 2"  (1.575 m)   Wt 117 lb 12.8 oz (53.4 kg)   SpO2 100%   BMI 21.55 kg/m   Vital signs reviewed. GENERAL: Well-developed, well-nourished, no acute distress. CARDIOVASCULAR: Regular rate and rhythm no murmur gallop or rub LUNGS: Clear to auscultation bilaterally, no rales or wheeze. ABDOMEN: Soft positive bowel sounds NEURO: No gross focal neurological deficits. MSK: Movement of extremity x 4.  ASSESSMENT / PLAN:   Hypertension Much better control.  She does have a component of whitecoat hypertension so we will use should remember to repeat her blood pressure after she is had part of her visit as there is a significant difference.  Will continue current medications.  Follow-up in 3 months  Anxious depression Anxiety and insomnia doing a little bit better.  Will continue current dosing of diazepam .  I have no concerns for abuse or misuse of this medication.  Will also continue SSRI.  Follow-up 3 months.  GERD (gastroesophageal reflux disease) Continue PPI at higher dose.  She was taking some old 20 mg omeprazole  for breakthrough and I would rather have her take famotidine  so we will give her a prescription of that.  Follow-up 3 months.   Violetta Grice MD

## 2023-07-13 NOTE — Assessment & Plan Note (Signed)
 Much better control.  She does have a component of whitecoat hypertension so we will use should remember to repeat her blood pressure after she is had part of her visit as there is a significant difference.  Will continue current medications.  Follow-up in 3 months

## 2023-10-11 ENCOUNTER — Other Ambulatory Visit: Payer: Self-pay | Admitting: Family Medicine

## 2023-12-04 ENCOUNTER — Other Ambulatory Visit: Payer: Self-pay | Admitting: Family Medicine

## 2023-12-04 MED ORDER — PREDNISONE 20 MG PO TABS: 20.0000 mg | ORAL_TABLET | Freq: Two times a day (BID) | ORAL | 2 refills | Status: AC

## 2023-12-04 NOTE — Progress Notes (Signed)
 Needs refill on prednisone  for gout attack

## 2024-02-09 ENCOUNTER — Encounter: Payer: Self-pay | Admitting: Family Medicine

## 2024-02-09 ENCOUNTER — Ambulatory Visit (INDEPENDENT_AMBULATORY_CARE_PROVIDER_SITE_OTHER): Payer: Self-pay | Admitting: Family Medicine

## 2024-02-09 VITALS — BP 228/108 | HR 67 | Wt 125.2 lb

## 2024-02-09 DIAGNOSIS — Z1211 Encounter for screening for malignant neoplasm of colon: Secondary | ICD-10-CM

## 2024-02-09 DIAGNOSIS — G8929 Other chronic pain: Secondary | ICD-10-CM

## 2024-02-09 DIAGNOSIS — I10 Essential (primary) hypertension: Secondary | ICD-10-CM | POA: Diagnosis not present

## 2024-02-09 DIAGNOSIS — M545 Low back pain, unspecified: Secondary | ICD-10-CM

## 2024-02-09 MED ORDER — HYDROCHLOROTHIAZIDE 12.5 MG PO CAPS: 12.5000 mg | ORAL_CAPSULE | Freq: Every day | ORAL | 11 refills | Status: AC

## 2024-02-09 MED ORDER — AMLODIPINE BESYLATE 10 MG PO TABS: 10.0000 mg | ORAL_TABLET | Freq: Every day | ORAL | 3 refills | Status: AC

## 2024-02-09 MED ORDER — QUETIAPINE FUMARATE 25 MG PO TABS: 25.0000 mg | ORAL_TABLET | Freq: Every day | ORAL | 1 refills | Status: AC

## 2024-02-09 NOTE — Progress Notes (Addendum)
" ° ° °  SUBJECTIVE:   CHIEF COMPLAINT / HPI:   Elevated BP in clinic She can tell when it is high my head starts to get warm, my lips tingle, I can feel every pulse. Sometimes vision goes dark, like midnight. Last episode months ago. Feels like something is going to pop here. Taking her BP meds faithfully every day.  Back pain Ongoing many years, intermittent. Since this past August she has had constant back pain. She has frequently used gabapentin, tramadol for this in the past with relief though gabapentin does make her weird. Also taking Advil daily. Pain is worst when she gets up in the morning. She has to take short steps until she takes her tramadol and then she can move better. She has been getting the tramadol from her brother. She has not had any recent imaging of her back. No loss of bowel/bladder control. Never had spine surgery or epidural steroid injections.  PERTINENT  PMH / PSH:  HTN, GERD, CKD stage III  OBJECTIVE:   BP (!) 228/108   Pulse 67   Wt 125 lb 3.2 oz (56.8 kg)   SpO2 95%   BMI 22.90 kg/m   General: well-appearing, very pleasant, no acute distress. HEENT: normocephalic, PERRLA, EOM grossly intact, MMM. Cardio: Regular rate, regular rhythm, no murmurs on exam. Pulm: Clear, no wheezing, no crackles. No increased work of breathing. Abdominal: bowel sounds present, soft, non-tender, non-distended. Extremities: No peripheral edema. Moves all extremities equally. Neuro: CN II: PERRL CN III, IV,VI: EOMI CV V: Normal sensation in V1, V2, V3 CVII: Symmetric smile and brow raise CN VIII: Normal hearing CN IX,X: Symmetric palate raise  CN XI: 5/5 shoulder shrug CN XII: Symmetric tongue protrusion  UE and LE strength 5/5 Normal sensation in UE and LE bilaterally  Psych:  Cognition and judgment appear intact. Alert, communicative, and cooperative with normal attention span and concentration.   ASSESSMENT/PLAN:   Assessment & Plan Severe  uncontrolled hypertension Strongly urged patient to go to emergency department; discussed risk of CVA or even death. She states understanding but strongly desires to go home. I appreciate Drs. Donzetta Fairly, and Hollister all discussing with the patient the severity of her current pressures. She still declines going to the ED. She is able to explain potential health consequences and states if she begins to feel worse she will call 911. - close follow up scheduled for Monday 12/22 - increase amlodipine  to 10 mg daily - add hydrochlorothiazide  12.5 mg daily - continue lopressor  as prescribed Chronic bilateral low back pain without sciatica Low suspicion for emergent etiology, no red flag signs. - ordered MRI; she may benefit from epidural steroid injections in the future pending imaging results. - continue advil - discuss tramadol rx at follow up (she has been taking rx from brother) Colon cancer screening Cologuard ordered.     Lauraine Norse, DO [facility] "

## 2024-02-09 NOTE — Patient Instructions (Addendum)
 Blood pressure medicines: - amlodipine  increased to 10 mg - start new medicine hydrochlorothiazide  12.5 mg - continue taking your lopressor  as prescribed - check your pressures at home and jeep a log of these numbers - follow up on Monday  If your symptoms worsen or you have any other concerns please go to the emergency department immediately.

## 2024-02-12 ENCOUNTER — Ambulatory Visit

## 2024-02-12 NOTE — Progress Notes (Deleted)
" ° ° °  SUBJECTIVE:   CHIEF COMPLAINT / HPI:   HTN -seen in clinic on 12/19 with elevated BP, was encouraged to go to ED but declined - at that visit, amlodipine  increased to 10mg , added hydrochlorothiazide  12.5mg , continued lopressor   BP Readings from Last 5 Encounters:  02/09/24 (!) 228/108  07/12/23 138/80  06/02/23 138/78  04/26/23 (!) 156/78  03/13/23 (!) 142/70     PERTINENT  PMH / PSH: HTN, back pain  OBJECTIVE:   There were no vitals taken for this visit.  ***  ASSESSMENT/PLAN:   Assessment & Plan Primary hypertension      Milda LITTIE Deed, MD [facility] "

## 2024-02-17 ENCOUNTER — Other Ambulatory Visit: Payer: Self-pay | Admitting: Family Medicine

## 2024-02-17 DIAGNOSIS — I10 Essential (primary) hypertension: Secondary | ICD-10-CM

## 2024-02-19 ENCOUNTER — Ambulatory Visit (HOSPITAL_COMMUNITY)
Admission: RE | Admit: 2024-02-19 | Discharge: 2024-02-19 | Disposition: A | Discharge status: Home / Self Care | Source: Ambulatory Visit | Attending: Family Medicine | Admitting: Family Medicine

## 2024-02-19 DIAGNOSIS — G8929 Other chronic pain: Secondary | ICD-10-CM | POA: Insufficient documentation

## 2024-02-19 DIAGNOSIS — M51369 Other intervertebral disc degeneration, lumbar region without mention of lumbar back pain or lower extremity pain: Secondary | ICD-10-CM

## 2024-02-19 DIAGNOSIS — M545 Low back pain, unspecified: Secondary | ICD-10-CM | POA: Diagnosis present

## 2024-02-20 ENCOUNTER — Ambulatory Visit: Payer: Self-pay | Admitting: Family Medicine

## 2024-02-20 ENCOUNTER — Encounter: Payer: Self-pay | Admitting: Family Medicine

## 2024-02-20 VITALS — BP 135/75 | HR 60 | Ht 62.0 in | Wt 123.0 lb

## 2024-02-20 DIAGNOSIS — I10 Essential (primary) hypertension: Secondary | ICD-10-CM | POA: Diagnosis not present

## 2024-02-20 DIAGNOSIS — G8929 Other chronic pain: Secondary | ICD-10-CM

## 2024-02-20 DIAGNOSIS — M545 Low back pain, unspecified: Secondary | ICD-10-CM | POA: Diagnosis not present

## 2024-02-20 MED ORDER — DICLOFENAC SODIUM 1 % EX GEL: 2.0000 g | Freq: Four times a day (QID) | CUTANEOUS | 3 refills | Status: AC | PRN

## 2024-02-20 NOTE — Progress Notes (Signed)
" ° ° °  SUBJECTIVE:   CHIEF COMPLAINT / HPI:   HTN Started new medication hydrochlorothiazide  12.5 mg at last visit - says this gave her some mild nausea but otherwise no concerns. She has also continued her lopressor , lisinopril . She brings a BP log today which shows elevated pressures but improvement with new medication (see media tab). Most recent home pressure as low as 155/77. No headaches/throbbing, vision changes.  PERTINENT  PMH / PSH: Reviewed.  OBJECTIVE:   BP 135/75   Pulse 60   Ht 5' 2 (1.575 m)   Wt 123 lb (55.8 kg)   SpO2 99%   BMI 22.50 kg/m   General: well-appearing, no acute distress. HEENT: normocephalic, PERRLA, EOM grossly intact, MMM. Cardio: Regular rate, regular rhythm, no murmurs on exam. Pulm: Clear, no wheezing, no crackles. No increased work of breathing. Abdominal: non-tender, non-distended. Extremities: No peripheral edema. Moves all extremities equally. Neuro: Alert and oriented x3, speech normal in content, no facial asymmetry Psych:  Cognition and judgment appear intact. Alert, communicative, and cooperative   ASSESSMENT/PLAN:   Assessment & Plan Essential hypertension Greatly improved today from last visit - appears that she may be controlled now on this regimen. Reassuringly none of the prior symtpoms of elevated pressures such as HA, throbbing head, etc. - BMP today to monitor kidney fxn on hydrochlorothiazide  - continue hydrochlorothiazide  12.5 mg, lisinopril  40 mg, lopressor  50 mg daily - follow up with PCP in 1 week Chronic bilateral low back pain without sciatica She has been using her brother's tramadol rx with great relief and would like to have this prescribed to her; encouraged adherence to prescribed medication regimen and discussed dangers of sharing medications. Discussed that I would prefer PCP decide on tramadol rx. - MRI L spine pending official read - plan to discuss tramadol with PCP at upcoming visit - in the meantime  lidocaine patches or Voltaren  gel PRN     Lauraine Norse, DO [facility] "

## 2024-02-20 NOTE — Patient Instructions (Addendum)
 Your blood pressure looks much better! Continue taking your medications as prescribed.  Try Voltaren  gel or lidocaine patches for your back pain. Please discuss tramadol with your PCP at next appointment.

## 2024-02-21 ENCOUNTER — Ambulatory Visit: Payer: Self-pay | Admitting: Family Medicine

## 2024-02-21 LAB — BASIC METABOLIC PANEL WITH GFR
BUN/Creatinine Ratio: 31 — ABNORMAL HIGH (ref 12–28)
BUN: 43 mg/dL — ABNORMAL HIGH (ref 8–27)
CO2: 24 mmol/L (ref 20–29)
Calcium: 10 mg/dL (ref 8.7–10.3)
Chloride: 101 mmol/L (ref 96–106)
Creatinine, Ser: 1.38 mg/dL — ABNORMAL HIGH (ref 0.57–1.00)
Glucose: 112 mg/dL — ABNORMAL HIGH (ref 70–99)
Potassium: 5.1 mmol/L (ref 3.5–5.2)
Sodium: 140 mmol/L (ref 134–144)
eGFR: 42 mL/min/1.73 — ABNORMAL LOW

## 2024-02-28 ENCOUNTER — Encounter: Payer: Self-pay | Admitting: Family Medicine

## 2024-02-28 ENCOUNTER — Ambulatory Visit (INDEPENDENT_AMBULATORY_CARE_PROVIDER_SITE_OTHER): Admitting: Family Medicine

## 2024-02-28 VITALS — BP 135/78 | HR 88 | Wt 123.6 lb

## 2024-02-28 DIAGNOSIS — Z609 Problem related to social environment, unspecified: Secondary | ICD-10-CM

## 2024-02-28 DIAGNOSIS — I10 Essential (primary) hypertension: Secondary | ICD-10-CM

## 2024-02-28 MED ORDER — TRAMADOL HCL 50 MG PO TABS: ORAL_TABLET | ORAL | 3 refills | Status: AC

## 2024-02-29 NOTE — Assessment & Plan Note (Signed)
 Significant white coat component Recheck and home BP readings always significantly better Today home BP readings as low as 115 systolic, none > 864 systolic. Diastolics in 65-80 range. Continue current 4 meds Stress reduction needed

## 2024-02-29 NOTE — Progress Notes (Signed)
" ° ° °  CHIEF COMPLAINT / HPI:  HTN: taking all meds without problem 2. Stressors significant increase (Mother's health and she is primary caretaker) not getting regular sleep, feeling very stressed. Sister has moved back home to help with care of their mother Questions about Mom's dx and recent work up   PERTINENT  PMH / PSH: I have reviewed the patients medications, allergies, past medical and surgical history, smoking status and updated in the EMR as appropriate. Social stressors ongoing at home  OBJECTIVE:  BP 135/78 (Cuff Size: Normal)   Pulse 88   Wt 123 lb 9.6 oz (56.1 kg)   SpO2 100%   BMI 22.61 kg/m   CV RRR PSYCH: AxOx4. Good eye contact.. No psychomotor retardation or agitation. Appropriate speech fluency and content. Asks and answers questions appropriately. Mood is congruent.  ASSESSMENT / PLAN:   Primary Hypertension with significant white coat component Significant white coat component Recheck and home BP readings always significantly better Today home BP readings as low as 115 systolic, none > 864 systolic. Diastolics in 65-80 range. Continue current 4 meds Stress reduction needed  High risk social situation Discussed care of her Mother (dementia) she is doing awesome job Glad she has some help now from sister Community resources for support group for her *caretaker) discussed, emphasized absolute need and info given Counseling and discussion time= 30 minutes F/u 4-6 weeks    Camie Mulch MD  "

## 2024-02-29 NOTE — Assessment & Plan Note (Signed)
 Discussed care of her Mother (dementia) she is doing awesome job Glad she has some help now from sister Community resources for support group for her *caretaker) discussed, emphasized absolute need and info given Counseling and discussion time= 30 minutes F/u 4-6 weeks

## 2024-03-08 LAB — COLOGUARD: COLOGUARD: NEGATIVE

## 2024-03-09 ENCOUNTER — Ambulatory Visit: Payer: Self-pay | Admitting: Family Medicine

## 2024-03-27 ENCOUNTER — Other Ambulatory Visit: Payer: Self-pay

## 2024-03-28 MED ORDER — FAMOTIDINE 20 MG PO TABS: ORAL_TABLET | ORAL | 3 refills | Status: AC

## 2024-04-17 ENCOUNTER — Ambulatory Visit: Admitting: Family Medicine
